# Patient Record
Sex: Female | Born: 1970 | Race: White | Hispanic: No | Marital: Married | Smoking: Never smoker
Health system: Southern US, Community
[De-identification: ages and names within clinical notes are randomized; demographics above are authoritative.]

## PROBLEM LIST (undated history)

## (undated) DIAGNOSIS — T7840XA Allergy, unspecified, initial encounter: Secondary | ICD-10-CM

## (undated) DIAGNOSIS — N390 Urinary tract infection, site not specified: Secondary | ICD-10-CM

## (undated) DIAGNOSIS — A159 Respiratory tuberculosis unspecified: Secondary | ICD-10-CM

## (undated) DIAGNOSIS — G43909 Migraine, unspecified, not intractable, without status migrainosus: Secondary | ICD-10-CM

## (undated) HISTORY — DX: Migraine, unspecified, not intractable, without status migrainosus: G43.909

## (undated) HISTORY — DX: Urinary tract infection, site not specified: N39.0

## (undated) HISTORY — DX: Respiratory tuberculosis unspecified: A15.9

## (undated) HISTORY — DX: Allergy, unspecified, initial encounter: T78.40XA

## (undated) HISTORY — PX: APPENDECTOMY: SHX54

---
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
## 2015-01-26 ENCOUNTER — Other Ambulatory Visit (HOSPITAL_BASED_OUTPATIENT_CLINIC_OR_DEPARTMENT_OTHER): Payer: Self-pay | Admitting: Family Medicine

## 2015-01-26 DIAGNOSIS — Z1231 Encounter for screening mammogram for malignant neoplasm of breast: Secondary | ICD-10-CM

## 2015-02-09 ENCOUNTER — Ambulatory Visit (HOSPITAL_BASED_OUTPATIENT_CLINIC_OR_DEPARTMENT_OTHER)
Admission: RE | Admit: 2015-02-09 | Discharge: 2015-02-09 | Disposition: A | Discharge status: Home / Self Care | Payer: BLUE CROSS/BLUE SHIELD | Source: Ambulatory Visit | Attending: Family Medicine | Admitting: Family Medicine

## 2015-02-09 DIAGNOSIS — Z1231 Encounter for screening mammogram for malignant neoplasm of breast: Secondary | ICD-10-CM | POA: Diagnosis present

## 2015-02-10 ENCOUNTER — Ambulatory Visit (HOSPITAL_BASED_OUTPATIENT_CLINIC_OR_DEPARTMENT_OTHER): Payer: Self-pay

## 2015-02-10 ENCOUNTER — Inpatient Hospital Stay (HOSPITAL_BASED_OUTPATIENT_CLINIC_OR_DEPARTMENT_OTHER): Admission: RE | Admit: 2015-02-10 | Payer: Self-pay | Source: Ambulatory Visit

## 2016-01-22 ENCOUNTER — Other Ambulatory Visit (HOSPITAL_BASED_OUTPATIENT_CLINIC_OR_DEPARTMENT_OTHER): Payer: Self-pay | Admitting: Physician Assistant

## 2016-01-22 ENCOUNTER — Other Ambulatory Visit (HOSPITAL_BASED_OUTPATIENT_CLINIC_OR_DEPARTMENT_OTHER): Payer: Self-pay | Admitting: *Deleted

## 2016-01-22 DIAGNOSIS — Z1231 Encounter for screening mammogram for malignant neoplasm of breast: Secondary | ICD-10-CM

## 2016-02-23 ENCOUNTER — Ambulatory Visit (HOSPITAL_BASED_OUTPATIENT_CLINIC_OR_DEPARTMENT_OTHER)
Admission: RE | Admit: 2016-02-23 | Discharge: 2016-02-23 | Disposition: A | Discharge status: Home / Self Care | Payer: BLUE CROSS/BLUE SHIELD | Source: Ambulatory Visit | Attending: Physician Assistant | Admitting: Physician Assistant

## 2016-02-23 DIAGNOSIS — Z1231 Encounter for screening mammogram for malignant neoplasm of breast: Secondary | ICD-10-CM | POA: Insufficient documentation

## 2017-01-23 ENCOUNTER — Other Ambulatory Visit (HOSPITAL_BASED_OUTPATIENT_CLINIC_OR_DEPARTMENT_OTHER): Payer: Self-pay | Admitting: *Deleted

## 2017-01-23 ENCOUNTER — Other Ambulatory Visit: Payer: Self-pay | Admitting: Family Medicine

## 2017-01-23 DIAGNOSIS — Z1231 Encounter for screening mammogram for malignant neoplasm of breast: Secondary | ICD-10-CM

## 2017-02-24 ENCOUNTER — Ambulatory Visit (HOSPITAL_BASED_OUTPATIENT_CLINIC_OR_DEPARTMENT_OTHER)
Admission: RE | Admit: 2017-02-24 | Discharge: 2017-02-24 | Disposition: A | Discharge status: Home / Self Care | Payer: BLUE CROSS/BLUE SHIELD | Source: Ambulatory Visit | Attending: Family Medicine | Admitting: Family Medicine

## 2017-02-24 DIAGNOSIS — Z1231 Encounter for screening mammogram for malignant neoplasm of breast: Secondary | ICD-10-CM | POA: Insufficient documentation

## 2018-02-09 ENCOUNTER — Other Ambulatory Visit (HOSPITAL_BASED_OUTPATIENT_CLINIC_OR_DEPARTMENT_OTHER): Payer: Self-pay

## 2018-02-09 ENCOUNTER — Other Ambulatory Visit (HOSPITAL_BASED_OUTPATIENT_CLINIC_OR_DEPARTMENT_OTHER): Payer: Self-pay | Admitting: Family Medicine

## 2018-02-09 ENCOUNTER — Other Ambulatory Visit (HOSPITAL_BASED_OUTPATIENT_CLINIC_OR_DEPARTMENT_OTHER): Payer: Self-pay | Admitting: Pediatric Pulmonology

## 2018-02-09 DIAGNOSIS — Z1231 Encounter for screening mammogram for malignant neoplasm of breast: Secondary | ICD-10-CM

## 2018-02-24 ENCOUNTER — Other Ambulatory Visit (HOSPITAL_BASED_OUTPATIENT_CLINIC_OR_DEPARTMENT_OTHER): Payer: Self-pay | Admitting: Physician Assistant

## 2018-02-24 ENCOUNTER — Ambulatory Visit (HOSPITAL_BASED_OUTPATIENT_CLINIC_OR_DEPARTMENT_OTHER)
Admission: RE | Admit: 2018-02-24 | Discharge: 2018-02-24 | Disposition: A | Discharge status: Home / Self Care | Payer: 59 | Source: Ambulatory Visit | Attending: Family Medicine | Admitting: Family Medicine

## 2018-02-24 DIAGNOSIS — Z1231 Encounter for screening mammogram for malignant neoplasm of breast: Secondary | ICD-10-CM | POA: Insufficient documentation

## 2019-02-24 ENCOUNTER — Other Ambulatory Visit (HOSPITAL_BASED_OUTPATIENT_CLINIC_OR_DEPARTMENT_OTHER): Payer: Self-pay | Admitting: Physician Assistant

## 2019-02-24 DIAGNOSIS — Z1231 Encounter for screening mammogram for malignant neoplasm of breast: Secondary | ICD-10-CM

## 2019-03-04 ENCOUNTER — Ambulatory Visit (HOSPITAL_BASED_OUTPATIENT_CLINIC_OR_DEPARTMENT_OTHER)
Admission: RE | Admit: 2019-03-04 | Discharge: 2019-03-04 | Disposition: A | Discharge status: Home / Self Care | Payer: 59 | Source: Ambulatory Visit | Attending: Physician Assistant | Admitting: Physician Assistant

## 2019-03-04 ENCOUNTER — Other Ambulatory Visit: Payer: Self-pay

## 2019-03-04 DIAGNOSIS — Z1231 Encounter for screening mammogram for malignant neoplasm of breast: Secondary | ICD-10-CM | POA: Diagnosis not present

## 2019-04-30 LAB — BASIC METABOLIC PANEL
BUN: 11 (ref 4–21)
CO2: 27 — AB (ref 13–22)
Chloride: 105 (ref 99–108)
Creatinine: 0.8 (ref 0.5–1.1)
Glucose: 88
Potassium: 3.5 (ref 3.4–5.3)
Sodium: 140 (ref 137–147)

## 2019-04-30 LAB — HEPATIC FUNCTION PANEL
ALT: 22 (ref 7–35)
AST: 19 (ref 13–35)
Alkaline Phosphatase: 46 (ref 25–125)
Bilirubin, Total: 0.6

## 2019-04-30 LAB — LIPID PANEL
Cholesterol: 179 (ref 0–200)
HDL: 43 (ref 35–70)
LDL Cholesterol: 117
LDl/HDL Ratio: 4.2
Triglycerides: 104 (ref 40–160)

## 2019-04-30 LAB — COMPREHENSIVE METABOLIC PANEL
Albumin: 4.2 (ref 3.5–5.0)
Calcium: 8.9 (ref 8.7–10.7)
GFR calc Af Amer: 95
GFR calc non Af Amer: 79

## 2020-02-29 ENCOUNTER — Other Ambulatory Visit (HOSPITAL_BASED_OUTPATIENT_CLINIC_OR_DEPARTMENT_OTHER): Payer: Self-pay | Admitting: Family Medicine

## 2020-02-29 DIAGNOSIS — Z1231 Encounter for screening mammogram for malignant neoplasm of breast: Secondary | ICD-10-CM

## 2020-03-06 ENCOUNTER — Other Ambulatory Visit (HOSPITAL_BASED_OUTPATIENT_CLINIC_OR_DEPARTMENT_OTHER): Payer: Self-pay | Admitting: *Deleted

## 2020-03-06 ENCOUNTER — Other Ambulatory Visit (HOSPITAL_BASED_OUTPATIENT_CLINIC_OR_DEPARTMENT_OTHER): Payer: Self-pay | Admitting: Nurse Practitioner

## 2020-03-06 ENCOUNTER — Ambulatory Visit (HOSPITAL_BASED_OUTPATIENT_CLINIC_OR_DEPARTMENT_OTHER)
Admission: RE | Admit: 2020-03-06 | Discharge: 2020-03-06 | Disposition: A | Discharge status: Home / Self Care | Payer: 59 | Source: Ambulatory Visit | Attending: Family Medicine | Admitting: Family Medicine

## 2020-03-06 ENCOUNTER — Other Ambulatory Visit: Payer: Self-pay

## 2020-03-06 DIAGNOSIS — Z1231 Encounter for screening mammogram for malignant neoplasm of breast: Secondary | ICD-10-CM | POA: Diagnosis not present

## 2020-03-06 LAB — HM MAMMOGRAPHY

## 2021-02-20 ENCOUNTER — Other Ambulatory Visit: Payer: Self-pay

## 2021-02-20 ENCOUNTER — Encounter: Payer: Self-pay | Admitting: Family Medicine

## 2021-02-20 ENCOUNTER — Ambulatory Visit: Payer: No Typology Code available for payment source | Admitting: Family Medicine

## 2021-02-20 VITALS — BP 122/75 | HR 79 | Temp 98.3°F | Ht 66.25 in | Wt 178.0 lb

## 2021-02-20 DIAGNOSIS — N951 Menopausal and female climacteric states: Secondary | ICD-10-CM | POA: Diagnosis not present

## 2021-02-20 DIAGNOSIS — G8929 Other chronic pain: Secondary | ICD-10-CM

## 2021-02-20 DIAGNOSIS — M25512 Pain in left shoulder: Secondary | ICD-10-CM

## 2021-02-20 DIAGNOSIS — N926 Irregular menstruation, unspecified: Secondary | ICD-10-CM | POA: Insufficient documentation

## 2021-02-20 DIAGNOSIS — Z23 Encounter for immunization: Secondary | ICD-10-CM | POA: Diagnosis not present

## 2021-02-20 DIAGNOSIS — F418 Other specified anxiety disorders: Secondary | ICD-10-CM

## 2021-02-20 DIAGNOSIS — R232 Flushing: Secondary | ICD-10-CM

## 2021-02-20 DIAGNOSIS — E559 Vitamin D deficiency, unspecified: Secondary | ICD-10-CM | POA: Insufficient documentation

## 2021-02-20 MED ORDER — MELOXICAM 7.5 MG PO TABS: ORAL_TABLET | ORAL | 5 refills | Status: DC

## 2021-02-20 MED ORDER — ESCITALOPRAM OXALATE 10 MG PO TABS: 10.0000 mg | ORAL_TABLET | Freq: Every day | ORAL | 1 refills | Status: DC

## 2021-02-20 NOTE — Progress Notes (Signed)
Patient ID: Nichole Ray, female  DOB: 1971/01/15, 51 y.o.   MRN: [id number] Patient Care Team    Relationship Specialty Notifications Start End  Natalia LeatherwoodKuneff, Sativa Gelles A, DO PCP - General Family Medicine  02/20/21   Gelene MinkKoop, Timothy, OD Referring Physician Optometry  02/20/21     Chief Complaint  Patient presents with   Establish Care    Pt is not fasting   Shoulder Pain    Pt c/o L shoulder pain x 1 year; Pt has been seen and was given Mobic which had improved but still there;     Subjective:  Nichole Ray is a 51 y.o.  female present for new patient establishment/acute. All past medical history, surgical history, allergies, family history, immunizations, medications and social history were updated in the electronic medical record today. All recent labs, ED visits and hospitalizations within the last year were reviewed.  Shoulder pain: Patient reports she has had left shoulder pain for about a year.  She was provided with Mobic during that time, and felt it did help.  She has not been taking it recently.  She has noticed the pain is worse when resting or laying on that side.  She has some mild weakness.  She denies any known injury prior to onset of symptoms.  Anxiousness/hot flashes: Patient reports she has some anxiety surrounding her daughter's mental health.  This is causing her more anxiousness.  She also is in perimenopausal state and noticing hot flashes.  She would like to consider starting a medication to help with both of these matters.  [facility]  Decreased Interest 0  Down, Depressed, Hopeless 0  PHQ - 2 Score 0    No flowsheet data found.      No flowsheet data found.   Immunization History  Administered Date(s) Administered   Influenza Split 12/15/2018   Influenza,inj,Quad PF,6+ Mos 11/18/2017, 02/20/2021   Influenza,inj,quad, With Preservative 01/24/2015   Influenza-Unspecified 11/22/2015   Moderna SARS-COV2 Booster Vaccination  01/08/2020   Moderna Sars-Covid-2 Vaccination 04/19/2019, 05/17/2019   Td 04/25/2009    No results found.  Past Medical History:  Diagnosis Date   Allergies    Migraines    Tuberculosis    UTI (urinary tract infection)    Allergies  Allergen Reactions   Penicillins Rash   Past Surgical History:  Procedure Laterality Date   APPENDECTOMY  1990   Family History  Problem Relation Age of Onset   Hypertension Mother    Alcohol abuse Father    Asthma Paternal Grandfather    Arthritis Paternal Grandfather    Depression Daughter    Social History   Social History Narrative   Marital status/children/pets: Married   Education/employment: Bachelor's degree, employed.   Safety:      -smoke alarm in the home:Yes     - wears seatbelt: Yes     - Feels safe in their relationships: Yes       Allergies as of 02/20/2021       Reactions   Penicillins Rash        Medication List        Accurate as of February 20, 2021 11:59 PM. If you have any questions, ask your nurse or doctor.          escitalopram 10 MG tablet Commonly known as: Lexapro Take 1 tablet (10 mg total) by mouth daily. Started by: Felix Pacinienee Donathan Buller, DO   Melatonin 10 MG Tabs Take by mouth.  meloxicam 7.5 MG tablet Commonly known as: MOBIC Nightly after dinner. Started by: Felix Pacini, DO        All past medical history, surgical history, allergies, family history, immunizations andmedications were updated in the EMR today and reviewed under the history and medication portions of their EMR.    No results found for this or any previous visit (from the past 2160 hour(s)).    ROS 14 pt review of systems performed and negative (unless mentioned in an HPI)  Objective:  BP 122/75    Pulse 79    Temp 98.3 F (36.8 C) (Oral)    Ht 5' 6.25" (1.683 m)    Wt 178 lb (80.7 kg)    SpO2 97%    BMI 28.51 kg/m   Physical Exam Vitals and nursing note reviewed.  Constitutional:      General: She is not in  acute distress.    Appearance: Normal appearance. She is not ill-appearing, toxic-appearing or diaphoretic.  HENT:     Head: Normocephalic and atraumatic.  Eyes:     General: No scleral icterus.       Right eye: No discharge.        Left eye: No discharge.     Extraocular Movements: Extraocular movements intact.     Conjunctiva/sclera: Conjunctivae normal.     Pupils: Pupils are equal, round, and reactive to light.  Cardiovascular:     Rate and Rhythm: Normal rate and regular rhythm.     Heart sounds: No murmur heard.   No friction rub. No gallop.  Pulmonary:     Effort: Pulmonary effort is normal. No respiratory distress.     Breath sounds: Normal breath sounds. No wheezing, rhonchi or rales.  Musculoskeletal:     Right shoulder: Normal.     Left shoulder: No swelling, deformity, effusion, laceration, tenderness, bony tenderness or crepitus. Normal range of motion. Decreased strength. Normal pulse.     Cervical back: Neck supple. No tenderness.     Comments: Positive Hawkins, negative O'Brien's, negative liftoff test.  Lymphadenopathy:     Cervical: No cervical adenopathy.  Skin:    General: Skin is warm and dry.     Coloration: Skin is not jaundiced or pale.     Findings: No erythema or rash.  Neurological:     Mental Status: She is alert and oriented to person, place, and time. Mental status is at baseline.     Motor: No weakness.     Gait: Gait normal.  Psychiatric:        Mood and Affect: Mood normal.        Behavior: Behavior normal.        Thought Content: Thought content normal.        Judgment: Judgment normal.     Comments: Tearful today.       Assessment/plan: [name] is a 51 y.o. female present for est care with acute complaints.  Chronic left shoulder pain Suspect rotator strain at 1 time that is not properly healing.  Possibly small/partial tear. Continue Mobic Obtain shoulder x-ray and if normal, refer to physical therapy. - DG Shoulder Left;  Future  Situational anxiety/hot flashes/perimenopausal Discussed options with her today on different types of medicines that can help with the anxiety and is also known to help decrease hot flashes.  She is willing to try. Start Lexapro 10 mg daily Follow-up 8 weeks for follow-up and CPE.,  Sooner if worsening  Influenza vaccination given - Flu Vaccine QUAD  6+ mos PF IM (Fluarix Quad PF)   Return in 8 weeks (on 04/17/2021) for CPE (30 min) with in next 3-6 mos. w/ fasting labs.  Orders Placed This Encounter  Procedures   DG Shoulder Left   Flu Vaccine QUAD 6+ mos PF IM (Fluarix Quad PF)   Meds ordered this encounter  Medications   meloxicam (MOBIC) 7.5 MG tablet    Sig: Nightly after dinner.    Dispense:  30 tablet    Refill:  5   escitalopram (LEXAPRO) 10 MG tablet    Sig: Take 1 tablet (10 mg total) by mouth daily.    Dispense:  90 tablet    Refill:  1   Referral Orders  No referral(s) requested today     Note is dictated utilizing voice recognition software. Although note has been proof read prior to signing, occasional typographical errors still can be missed. If any questions arise, please do not hesitate to call for verification.  Electronically signed by: Felix Pacini, DO [facility] [place]

## 2021-02-20 NOTE — Patient Instructions (Addendum)
°  Start meloxicam after dinner daily.  Have xray completed at [facility] is located at [address] [place], [place] [address]  Start lexapro 10 mg a day, can be taken anytime of the day, just stick to a routine time when possible.   We will likely place a referral to physical therapy once xray returns (if normal)

## 2021-02-26 ENCOUNTER — Other Ambulatory Visit (HOSPITAL_BASED_OUTPATIENT_CLINIC_OR_DEPARTMENT_OTHER): Payer: Self-pay | Admitting: Family Medicine

## 2021-02-26 ENCOUNTER — Encounter: Payer: Self-pay | Admitting: Family Medicine

## 2021-02-26 DIAGNOSIS — Z1231 Encounter for screening mammogram for malignant neoplasm of breast: Secondary | ICD-10-CM

## 2021-03-12 ENCOUNTER — Telehealth: Payer: Self-pay | Admitting: Family Medicine

## 2021-03-12 ENCOUNTER — Ambulatory Visit (HOSPITAL_BASED_OUTPATIENT_CLINIC_OR_DEPARTMENT_OTHER): Payer: BLUE CROSS/BLUE SHIELD

## 2021-03-12 NOTE — Telephone Encounter (Signed)
FYI:  Pt came into office on 03/12/2021 in regards to a referral for an x-ray.  I didn't see a referral for an x-ray, however I did see as for her last visit check-out section PAP Smear below  Please request pap smear report from 04/30/19 with Mitzi Hansen.  Thanks  Pt said not a pap smear, but x-ray.

## 2021-03-12 NOTE — Telephone Encounter (Signed)
Preferred imaging location? [facility]     Pt should be able to just walk in for xray. Order is under imaging for her shoulder.   LVM for pt to call back to discuss.

## 2021-03-13 NOTE — Telephone Encounter (Signed)
Patient calling back regarding xray.  Patient thought someone called with results (?)  Please call [phone]

## 2021-03-14 NOTE — Telephone Encounter (Signed)
Called pt that she will need to have the XR done prior to having results.

## 2021-03-16 ENCOUNTER — Ambulatory Visit (INDEPENDENT_AMBULATORY_CARE_PROVIDER_SITE_OTHER): Payer: No Typology Code available for payment source

## 2021-03-16 ENCOUNTER — Other Ambulatory Visit: Payer: Self-pay

## 2021-03-16 DIAGNOSIS — G8929 Other chronic pain: Secondary | ICD-10-CM | POA: Diagnosis not present

## 2021-03-16 DIAGNOSIS — M25512 Pain in left shoulder: Secondary | ICD-10-CM

## 2021-03-19 ENCOUNTER — Telehealth: Payer: Self-pay | Admitting: Family Medicine

## 2021-03-19 DIAGNOSIS — G8929 Other chronic pain: Secondary | ICD-10-CM

## 2021-03-19 DIAGNOSIS — M25512 Pain in left shoulder: Secondary | ICD-10-CM

## 2021-03-19 NOTE — Telephone Encounter (Signed)
Patient notified of note below. 

## 2021-03-19 NOTE — Telephone Encounter (Signed)
Please inform patient her shoulder x-ray is normal. I have placed a referral to [facility] physical therapy.

## 2021-03-20 ENCOUNTER — Encounter (HOSPITAL_BASED_OUTPATIENT_CLINIC_OR_DEPARTMENT_OTHER): Payer: Self-pay

## 2021-03-20 ENCOUNTER — Ambulatory Visit (HOSPITAL_BASED_OUTPATIENT_CLINIC_OR_DEPARTMENT_OTHER)
Admission: RE | Admit: 2021-03-20 | Discharge: 2021-03-20 | Disposition: A | Discharge status: Home / Self Care | Payer: No Typology Code available for payment source | Source: Ambulatory Visit | Attending: Family Medicine | Admitting: Family Medicine

## 2021-03-20 ENCOUNTER — Other Ambulatory Visit: Payer: Self-pay

## 2021-03-20 DIAGNOSIS — Z1231 Encounter for screening mammogram for malignant neoplasm of breast: Secondary | ICD-10-CM | POA: Diagnosis present

## 2021-04-18 ENCOUNTER — Other Ambulatory Visit: Payer: Self-pay

## 2021-04-18 ENCOUNTER — Encounter: Payer: Self-pay | Admitting: Family Medicine

## 2021-04-18 ENCOUNTER — Ambulatory Visit (INDEPENDENT_AMBULATORY_CARE_PROVIDER_SITE_OTHER): Payer: No Typology Code available for payment source | Admitting: Family Medicine

## 2021-04-18 VITALS — BP 100/64 | HR 67 | Temp 98.2°F | Ht 66.25 in | Wt 181.0 lb

## 2021-04-18 DIAGNOSIS — E559 Vitamin D deficiency, unspecified: Secondary | ICD-10-CM

## 2021-04-18 DIAGNOSIS — R232 Flushing: Secondary | ICD-10-CM | POA: Diagnosis not present

## 2021-04-18 DIAGNOSIS — Z114 Encounter for screening for human immunodeficiency virus [HIV]: Secondary | ICD-10-CM

## 2021-04-18 DIAGNOSIS — Z79899 Other long term (current) drug therapy: Secondary | ICD-10-CM

## 2021-04-18 DIAGNOSIS — Z1211 Encounter for screening for malignant neoplasm of colon: Secondary | ICD-10-CM

## 2021-04-18 DIAGNOSIS — Z1231 Encounter for screening mammogram for malignant neoplasm of breast: Secondary | ICD-10-CM | POA: Diagnosis not present

## 2021-04-18 DIAGNOSIS — F418 Other specified anxiety disorders: Secondary | ICD-10-CM | POA: Diagnosis not present

## 2021-04-18 DIAGNOSIS — N926 Irregular menstruation, unspecified: Secondary | ICD-10-CM | POA: Diagnosis not present

## 2021-04-18 DIAGNOSIS — E663 Overweight: Secondary | ICD-10-CM

## 2021-04-18 DIAGNOSIS — Z1159 Encounter for screening for other viral diseases: Secondary | ICD-10-CM

## 2021-04-18 DIAGNOSIS — Z0000 Encounter for general adult medical examination without abnormal findings: Secondary | ICD-10-CM | POA: Diagnosis not present

## 2021-04-18 LAB — COMPREHENSIVE METABOLIC PANEL
ALT: 21 U/L (ref 0–35)
AST: 21 U/L (ref 0–37)
Albumin: 4.2 g/dL (ref 3.5–5.2)
Alkaline Phosphatase: 51 U/L (ref 39–117)
BUN: 14 mg/dL (ref 6–23)
CO2: 25 mEq/L (ref 19–32)
Calcium: 8.9 mg/dL (ref 8.4–10.5)
Chloride: 106 mEq/L (ref 96–112)
Creatinine, Ser: 0.75 mg/dL (ref 0.40–1.20)
GFR: 93.11 mL/min (ref >60.00)
Glucose, Bld: 91 mg/dL (ref 70–99)
Potassium: 4.1 mEq/L (ref 3.5–5.1)
Sodium: 139 mEq/L (ref 135–145)
Total Bilirubin: 0.6 mg/dL (ref 0.2–1.2)
Total Protein: 6.7 g/dL (ref 6.0–8.3)

## 2021-04-18 LAB — CBC
HCT: 41 % (ref 36.0–46.0)
Hemoglobin: 13.7 g/dL (ref 12.0–15.0)
MCHC: 33.5 g/dL (ref 30.0–36.0)
MCV: 95.5 fl (ref 78.0–100.0)
Platelets: 326 10*3/uL (ref 150.0–400.0)
RBC: 4.3 Mil/uL (ref 3.87–5.11)
RDW: 13.5 % (ref 11.5–15.5)
WBC: 4.6 10*3/uL (ref 4.0–10.5)

## 2021-04-18 LAB — LIPID PANEL
Cholesterol: 177 mg/dL (ref 0–200)
HDL: 48.8 mg/dL (ref >39.00)
LDL Cholesterol: 111 mg/dL — ABNORMAL HIGH (ref 0–99)
NonHDL: 127.98
Total CHOL/HDL Ratio: 4
Triglycerides: 86 mg/dL (ref 0.0–149.0)
VLDL: 17.2 mg/dL (ref 0.0–40.0)

## 2021-04-18 LAB — HEMOGLOBIN A1C: Hgb A1c MFr Bld: 5.3 % (ref 4.6–6.5)

## 2021-04-18 LAB — VITAMIN D 25 HYDROXY (VIT D DEFICIENCY, FRACTURES): VITD: 22.25 ng/mL — ABNORMAL LOW (ref 30.00–100.00)

## 2021-04-18 LAB — TSH: TSH: 1.19 u[IU]/mL (ref 0.35–5.50)

## 2021-04-18 NOTE — Patient Instructions (Signed)

## 2021-04-18 NOTE — Progress Notes (Signed)
This visit occurred during the SARS-CoV-2 public health emergency.  Safety protocols were in place, including screening questions prior to the visit, additional usage of staff PPE, and extensive cleaning of exam room while observing appropriate contact time as indicated for disinfecting solutions.    Patient ID: Nichole Ray, female  DOB: 1970-11-03, 51 y.o.   MRN: [id number] Patient Care Team    Relationship Specialty Notifications Start End  Natalia Leatherwood, DO PCP - General Family Medicine  02/20/21   Gelene Mink, OD Referring Physician Optometry  02/20/21     Chief Complaint  Patient presents with   Annual Exam    Pt is fasting    Subjective: Nichole Ray is a 51 y.o.  Female  present for CPE/cmc. All past medical history, surgical history, allergies, family history, immunizations, medications and social history were updated in the electronic medical record today. All recent labs, ED visits and hospitalizations within the last year were reviewed.  Health maintenance:  Colonoscopy: no fhx. Screening due > discussed options > cologuard ordered today Mammogram: completed:[facility]/2021, [facility] Cervical cancer screening: last pap: 2021 Immunizations: tdap DUE (she thinks she had last year), Influenza UTD 02/2021 (encouraged yearly), covid UTD, shingrix series after 04/22/2021 if desired by nurse appt.  Infectious disease screening: HIV and Hep C agreeable to testing. DEXA: routine screen Assistive device: none Oxygen WUJ:WJXB Patient has a Dental home. Hospitalizations/ED visits: reviewed    Anxiousness/hot flashes: Patient reports she has some anxiety surrounding her daughter's mental health.  This is causing her more anxiousness.  She also is in perimenopausal state and noticing hot flashes.  Lexapro  was started about 7 weeks ago and patient reports she is better with anxiety and hotflashes  Depression screen [facility] 2/9 04/18/2021 02/20/2021  Decreased Interest 0 0  Down,  Depressed, Hopeless 0 0  PHQ - 2 Score 0 0  Altered sleeping 1 -  Tired, decreased energy 1 -  Change in appetite 0 -  Feeling bad or failure about yourself  0 -  Trouble concentrating 0 -  Moving slowly or fidgety/restless 0 -  Suicidal thoughts 0 -  PHQ-9 Score 2 -   GAD 7 : Generalized Anxiety Score 04/18/2021  Nervous, Anxious, on Edge 0  Control/stop worrying 0  Worry too much - different things 1  Trouble relaxing 0  Restless 0  Easily annoyed or irritable 0  Afraid - awful might happen 1  Total GAD 7 Score 2      Immunization History  Administered Date(s) Administered   Influenza Split 12/15/2018   Influenza,inj,Quad PF,6+ Mos 11/18/2017, 02/20/2021   Influenza,inj,quad, With Preservative 01/24/2015   Influenza-Unspecified 11/22/2015   Moderna SARS-COV2 Booster Vaccination 01/08/2020   Moderna Sars-Covid-2 Vaccination 04/19/2019, 05/17/2019   Td 04/25/2009   Tdap 02/12/2020     Past Medical History:  Diagnosis Date   Allergies    Migraines    Tuberculosis    UTI (urinary tract infection)    Allergies  Allergen Reactions   Penicillins Rash   Past Surgical History:  Procedure Laterality Date   APPENDECTOMY  1990   Family History  Problem Relation Age of Onset   Hypertension Mother    Alcohol abuse Father    Asthma Paternal Grandfather    Arthritis Paternal Grandfather    Depression Daughter    Social History   Social History Narrative   Marital status/children/pets: Married   Education/employment: Bachelor's degree, employed.   Safety:      -smoke alarm  in the home:Yes     - wears seatbelt: Yes     - Feels safe in their relationships: Yes       Allergies as of 04/18/2021       Reactions   Penicillins Rash        Medication List        Accurate as of April 18, 2021  9:52 AM. If you have any questions, ask your nurse or doctor.          STOP taking these medications    Melatonin 10 MG Tabs Stopped by: Felix Pacini, DO    meloxicam 7.5 MG tablet Commonly known as: MOBIC Stopped by: Felix Pacini, DO       TAKE these medications    escitalopram 10 MG tablet Commonly known as: Lexapro Take 1 tablet (10 mg total) by mouth daily.        All past medical history, surgical history, allergies, family history, immunizations andmedications were updated in the EMR today and reviewed under the history and medication portions of their EMR.     No results found for this or any previous visit (from the past 2160 hour(s)).  MM 3D SCREEN BREAST BILATERAL  Result Date: 03/20/2021 CLINICAL DATA:  Screening. EXAM: DIGITAL SCREENING BILATERAL MAMMOGRAM WITH TOMOSYNTHESIS AND CAD TECHNIQUE: Bilateral screening digital craniocaudal and mediolateral oblique mammograms were obtained. Bilateral screening digital breast tomosynthesis was performed. The images were evaluated with computer-aided detection. COMPARISON:  Previous exam(s). ACR Breast Density Category b: There are scattered areas of fibroglandular density. FINDINGS: There are no findings suspicious for malignancy. IMPRESSION: No mammographic evidence of malignancy. A result letter of this screening mammogram will be mailed directly to the patient. RECOMMENDATION: Screening mammogram in one year. (Code:SM-B-01Y) BI-RADS CATEGORY  1: Negative. Electronically Signed   By: Sande Brothers M.D.   On: 03/20/2021 16:24     ROS 14 pt review of systems performed and negative (unless mentioned in an HPI)  Objective: BP 100/64    Pulse 67    Temp 98.2 F (36.8 C) (Oral)    Ht 5' 6.25" (1.683 m)    Wt 181 lb (82.1 kg)    SpO2 100%    BMI 28.99 kg/m  Physical Exam Vitals and nursing note reviewed.  Constitutional:      General: She is not in acute distress.    Appearance: Normal appearance. She is not ill-appearing or toxic-appearing.  HENT:     Head: Normocephalic and atraumatic.     Right Ear: Tympanic membrane, ear canal and external ear normal. There is no impacted  cerumen.     Left Ear: Tympanic membrane, ear canal and external ear normal. There is no impacted cerumen.     Nose: No congestion or rhinorrhea.     Mouth/Throat:     Mouth: Mucous membranes are moist.     Pharynx: Oropharynx is clear. No oropharyngeal exudate or posterior oropharyngeal erythema.  Eyes:     General: No scleral icterus.       Right eye: No discharge.        Left eye: No discharge.     Extraocular Movements: Extraocular movements intact.     Conjunctiva/sclera: Conjunctivae normal.     Pupils: Pupils are equal, round, and reactive to light.  Cardiovascular:     Rate and Rhythm: Normal rate and regular rhythm.     Pulses: Normal pulses.     Heart sounds: Normal heart sounds. No murmur heard.   No friction  rub. No gallop.  Pulmonary:     Effort: Pulmonary effort is normal. No respiratory distress.     Breath sounds: Normal breath sounds. No stridor. No wheezing, rhonchi or rales.  Chest:     Chest wall: No tenderness.  Abdominal:     General: Abdomen is flat. Bowel sounds are normal. There is no distension.     Palpations: Abdomen is soft. There is no mass.     Tenderness: There is no abdominal tenderness. There is no right CVA tenderness, left CVA tenderness, guarding or rebound.     Hernia: No hernia is present.  Musculoskeletal:        General: No swelling, tenderness or deformity. Normal range of motion.     Cervical back: Normal range of motion and neck supple. No rigidity or tenderness.     Right lower leg: No edema.     Left lower leg: No edema.  Lymphadenopathy:     Cervical: No cervical adenopathy.  Skin:    General: Skin is warm and dry.     Coloration: Skin is not jaundiced or pale.     Findings: No bruising, erythema, lesion or rash.  Neurological:     General: No focal deficit present.     Mental Status: She is alert and oriented to person, place, and time. Mental status is at baseline.     Cranial Nerves: No cranial nerve deficit.     Sensory: No  sensory deficit.     Motor: No weakness.     Coordination: Coordination normal.     Gait: Gait normal.     Deep Tendon Reflexes: Reflexes normal.  Psychiatric:        Mood and Affect: Mood normal.        Behavior: Behavior normal.        Thought Content: Thought content normal.        Judgment: Judgment normal.      No results found.  Assessment/plan: Quinnley Colasurdo is a 51 y.o. female present for CPE/CMC Vitamin D deficiency - VITAMIN D 25 Hydroxy (Vit-D Deficiency, Fractures)  Irregular menstrual cycle/Situational anxiety/Hot flashes Mush improved.  Continue lexapro 10 mg qd - CBC - TSH F/u 5.5 mos Overweight (BMI 25.0-29.9) - Lipid panel - discussed diet and exercise.  Encounter for long-term current use of medication - Comprehensive metabolic panel - Hemoglobin A1c Encounter for screening mammogram for malignant neoplasm of breast - MM 3D SCREEN BREAST BILATERAL; Future Colon cancer screening - Cologuard Encounter for hepatitis C screening test for low risk patient - Hepatitis C Antibody Encounter for screening for HIV - HIV antibody (with reflex) Routine general medical examination at a health care facility Colonoscopy: no fhx. Screening due > discussed options > cologuard ordered today Mammogram: completed:[facility]/2021, [facility] Cervical cancer screening: last pap: 2021 Immunizations: tdap DUE (she thinks she had last year), Influenza UTD 02/2021 (encouraged yearly), covid UTD, shingrix series after 04/22/2021 if desired by nurse appt.  Infectious disease screening: HIV and Hep C agreeable to testing. DEXA: routine screen Patient was encouraged to exercise greater than 150 minutes a week. Patient was encouraged to choose a diet filled with fresh fruits and vegetables, and lean meats. AVS provided to patient today for education/recommendation on gender specific health and safety maintenance.  Return in about 24 weeks (around 10/03/2021) for CMC (30 min).   Orders  Placed This Encounter  Procedures   MM 3D SCREEN BREAST BILATERAL   Comprehensive metabolic panel   CBC  Hemoglobin A1c   Lipid panel   TSH   VITAMIN D 25 Hydroxy (Vit-D Deficiency, Fractures)   Cologuard   HIV antibody (with reflex)   Hepatitis C Antibody   No orders of the defined types were placed in this encounter.  Referral Orders  No referral(s) requested today     Electronically signed by: Felix Pacinienee Jameila Keeny, DO [facility]

## 2021-04-19 LAB — HEPATITIS C ANTIBODY
Hepatitis C Ab: NONREACTIVE
SIGNAL TO CUT-OFF: <0.02 (ref <1.00)

## 2021-04-19 LAB — HIV ANTIBODY (ROUTINE TESTING W REFLEX): HIV 1&2 Ab, 4th Generation: NONREACTIVE

## 2021-05-14 LAB — COLOGUARD: COLOGUARD: NEGATIVE

## 2021-08-16 ENCOUNTER — Other Ambulatory Visit: Payer: Self-pay | Admitting: Family Medicine

## 2021-09-18 ENCOUNTER — Other Ambulatory Visit: Payer: Self-pay | Admitting: Family Medicine

## 2021-10-09 ENCOUNTER — Ambulatory Visit: Payer: No Typology Code available for payment source | Admitting: Family Medicine

## 2021-10-18 ENCOUNTER — Encounter: Payer: Self-pay | Admitting: Family Medicine

## 2021-10-18 ENCOUNTER — Ambulatory Visit: Payer: No Typology Code available for payment source | Admitting: Family Medicine

## 2021-10-18 VITALS — BP 101/67 | HR 80 | Temp 98.1°F | Ht 66.25 in | Wt 167.0 lb

## 2021-10-18 DIAGNOSIS — Z23 Encounter for immunization: Secondary | ICD-10-CM | POA: Diagnosis not present

## 2021-10-18 DIAGNOSIS — F418 Other specified anxiety disorders: Secondary | ICD-10-CM | POA: Diagnosis not present

## 2021-10-18 DIAGNOSIS — R232 Flushing: Secondary | ICD-10-CM

## 2021-10-18 MED ORDER — ESCITALOPRAM OXALATE 10 MG PO TABS: 10.0000 mg | ORAL_TABLET | Freq: Every day | ORAL | 1 refills | Status: DC

## 2021-10-18 NOTE — Patient Instructions (Signed)
Return in about 6 months (around 04/22/2022) for cpe (20 min), Routine chronic condition follow-up.        Great to see you today.  I have refilled the medication(s) we provide.   If labs were collected, we will inform you of lab results once received either by echart message or telephone call.   - echart message- for normal results that have been seen by the patient already.   - telephone call: abnormal results or if patient has not viewed results in their echart.

## 2021-10-18 NOTE — Progress Notes (Signed)
Patient ID: Nichole Ray, female  DOB: 06-Oct-1970, 51 y.o.   MRN: [id number] Patient Care Team    Relationship Specialty Notifications Start End  Natalia Leatherwood, DO PCP - General Family Medicine  02/20/21   Gelene Mink, OD Referring Physician Optometry  02/20/21     Chief Complaint  Patient presents with   Anxiety    Cmc; pt is not fasting    Subjective: Nichole Ray is a 51 y.o.  Female  present for cmc. All past medical history, surgical history, allergies, family history, immunizations, medications and social history were updated in the electronic medical record today. All recent labs, ED visits and hospitalizations within the last year were reviewed.  Anxiousness/hot flashes: Today patient states Lexapro 10 mg is working well for her for stress and hot flashes. She has noticed she is mixing up words on occasion. She is wondering if that is stressed related. She is not sleeping restfully, but it has improved. She has lost weight unintentionally, but felt it was from being more active and less stress eating.   Prior note: Patient reports she has some anxiety surrounding her daughter's mental health.  This is causing her more anxiousness.  She also is in perimenopausal state and noticing hot flashes.  Lexapro 10mg  was started about 7 weeks ago and patient reports she is better with anxiety and hotflashes     10/18/2021    9:23 AM 04/18/2021    9:36 AM 02/20/2021    2:00 PM  Depression screen PHQ 2/9  Decreased Interest 0 0 0  Down, Depressed, Hopeless 0 0 0  PHQ - 2 Score 0 0 0  Altered sleeping 0 1   Tired, decreased energy 1 1   Change in appetite 0 0   Feeling bad or failure about yourself  0 0   Trouble concentrating 0 0   Moving slowly or fidgety/restless 0 0   Suicidal thoughts 0 0   PHQ-9 Score 1 2       10/18/2021    9:23 AM 04/18/2021    9:36 AM  GAD 7 : Generalized Anxiety Score  Nervous, Anxious, on Edge 0 0  Control/stop worrying 0 0  Worry too much -  different things 0 1  Trouble relaxing 0 0  Restless 0 0  Easily annoyed or irritable 0 0  Afraid - awful might happen 0 1  Total GAD 7 Score 0 2    Immunization History  Administered Date(s) Administered   Influenza Split 12/15/2018   Influenza,inj,Quad PF,6+ Mos 11/18/2017, 02/20/2021   Influenza,inj,quad, With Preservative 01/24/2015   Influenza-Unspecified 11/22/2015   Moderna SARS-COV2 Booster Vaccination 01/08/2020   Moderna Sars-Covid-2 Vaccination 04/19/2019, 05/17/2019   Td 04/25/2009   Tdap 02/12/2020   Zoster Recombinat (Shingrix) 10/18/2021     Past Medical History:  Diagnosis Date   Allergies    Migraines    Tuberculosis    UTI (urinary tract infection)    Allergies  Allergen Reactions   Penicillins Rash   Past Surgical History:  Procedure Laterality Date   APPENDECTOMY  1990   Family History  Problem Relation Age of Onset   Hypertension Mother    Alcohol abuse Father    Asthma Paternal Grandfather    Arthritis Paternal Grandfather    Depression Daughter    Social History   Social History Narrative   Marital status/children/pets: Married   Education/employment: Bachelor's degree, employed.   Safety:      -smoke alarm  in the home:Yes     - wears seatbelt: Yes     - Feels safe in their relationships: Yes       Allergies as of 10/18/2021       Reactions   Penicillins Rash        Medication List        Accurate as of October 18, 2021  9:46 AM. If you have any questions, ask your nurse or doctor.          escitalopram 10 MG tablet Commonly known as: LEXAPRO Take 1 tablet (10 mg total) by mouth daily.        All past medical history, surgical history, allergies, family history, immunizations andmedications were updated in the EMR today and reviewed under the history and medication portions of their EMR.     No results found for this or any previous visit (from the past 2160 hour(s)).  MM 3D SCREEN BREAST BILATERAL  Result  Date: 03/20/2021 CLINICAL DATA:  Screening. EXAM: DIGITAL SCREENING BILATERAL MAMMOGRAM WITH TOMOSYNTHESIS AND CAD TECHNIQUE: Bilateral screening digital craniocaudal and mediolateral oblique mammograms were obtained. Bilateral screening digital breast tomosynthesis was performed. The images were evaluated with computer-aided detection. COMPARISON:  Previous exam(s). ACR Breast Density Category b: There are scattered areas of fibroglandular density. FINDINGS: There are no findings suspicious for malignancy. IMPRESSION: No mammographic evidence of malignancy. A result letter of this screening mammogram will be mailed directly to the patient. RECOMMENDATION: Screening mammogram in one year. (Code:SM-B-01Y) BI-RADS CATEGORY  1: Negative. Electronically Signed   By: Sande Brothers M.D.   On: 03/20/2021 16:24     ROS 14 pt review of systems performed and negative (unless mentioned in an HPI)  Objective: BP 101/67   Pulse 80   Temp 98.1 F (36.7 C) (Oral)   Ht 5' 6.25" (1.683 m)   Wt 167 lb (75.8 kg)   SpO2 98%   BMI 26.75 kg/m  Physical Exam Vitals and nursing note reviewed.  Constitutional:      General: She is not in acute distress.    Appearance: Normal appearance. She is normal weight. She is not ill-appearing or toxic-appearing.  Eyes:     Extraocular Movements: Extraocular movements intact.     Conjunctiva/sclera: Conjunctivae normal.     Pupils: Pupils are equal, round, and reactive to light.  Neurological:     Mental Status: She is alert and oriented to person, place, and time. Mental status is at baseline.  Psychiatric:        Mood and Affect: Mood normal.        Behavior: Behavior normal.        Thought Content: Thought content normal.        Judgment: Judgment normal.       No results found.  Assessment/plan: [name] is a 51 y.o. female present for [facility] Vitamin D deficiency - VITAMIN D 25 Hydroxy (Vit-D Deficiency, Fractures) UTD> monitor yearly with CPE She has  noticed mixing up words on occasion (EX: calling the BR the kitchen etc- but corrected herself). If worsening will follow up sooner and b12, vit d thyroid check.   Situational anxiety/Hot flashes stable Continue  lexapro 10 mg qd  Shingrix: #1 provided today #2 by nurse visit in 3 months.   Return in about 6 months (around 04/22/2022) for cpe (20 min), Routine chronic condition follow-up.   Orders Placed This Encounter  Procedures   Varicella-zoster vaccine IM   Meds ordered this encounter  Medications   escitalopram (LEXAPRO) 10 MG tablet    Sig: Take 1 tablet (10 mg total) by mouth daily.    Dispense:  90 tablet    Refill:  1   Referral Orders  No referral(s) requested today     Electronically signed by: Felix Pacini, DO [facility] [place]

## 2021-12-26 ENCOUNTER — Encounter: Payer: Self-pay | Admitting: Family Medicine

## 2021-12-26 ENCOUNTER — Ambulatory Visit: Payer: No Typology Code available for payment source | Admitting: Family Medicine

## 2021-12-26 VITALS — BP 119/70 | HR 89 | Temp 98.5°F | Wt 169.4 lb

## 2021-12-26 DIAGNOSIS — R051 Acute cough: Secondary | ICD-10-CM | POA: Diagnosis not present

## 2021-12-26 DIAGNOSIS — J209 Acute bronchitis, unspecified: Secondary | ICD-10-CM

## 2021-12-26 LAB — POCT INFLUENZA A/B
Influenza A, POC: NEGATIVE
Influenza B, POC: NEGATIVE

## 2021-12-26 LAB — POCT RAPID STREP A (OFFICE): Rapid Strep A Screen: NEGATIVE

## 2021-12-26 LAB — POC COVID19 BINAXNOW: SARS Coronavirus 2 Ag: NEGATIVE

## 2021-12-26 MED ORDER — BENZONATATE 200 MG PO CAPS: 200.0000 mg | ORAL_CAPSULE | Freq: Two times a day (BID) | ORAL | 1 refills | Status: DC | PRN

## 2021-12-26 MED ORDER — DOXYCYCLINE HYCLATE 100 MG PO TABS: 100.0000 mg | ORAL_TABLET | Freq: Two times a day (BID) | ORAL | 0 refills | Status: DC

## 2021-12-26 NOTE — Patient Instructions (Addendum)
Return in about 2 weeks (around 01/09/2022), or if symptoms worsen or fail to improve.  Start over the counter mucinex DM (helps with phlegm and cough). Start allegra over the [employer] doxycyline (antibiotic) and completed full 10 day course.       Great to see you today.  I have refilled the medication(s) we provide.   If labs were collected, we will inform you of lab results once received either by echart message or telephone call.   - echart message- for normal results that have been seen by the patient already.   - telephone call: abnormal results or if patient has not viewed results in their echart.   Acute Bronchitis, Adult  Acute bronchitis is sudden inflammation of the main airways (bronchi) that come off the windpipe (trachea) in the lungs. The swelling causes the airways to get smaller and make more mucus than normal. This can make it hard to breathe and can cause coughing or noisy breathing (wheezing). Acute bronchitis may last several weeks. The cough may last longer. Allergies, asthma, and exposure to smoke may make the condition worse. What are the causes? This condition can be caused by germs and by substances that irritate the lungs, including: Cold and flu viruses. The most common cause of this condition is the virus that causes the common cold. Bacteria. This is less common. Breathing in substances that irritate the lungs, including: Smoke from cigarettes and other forms of tobacco. Dust and pollen. Fumes from household cleaning products, gases, or burned fuel. Indoor or outdoor air pollution. What increases the risk? The following factors may make you more likely to develop this condition: A weak body's defense system, also called the immune system. A condition that affects your lungs and breathing, such as asthma. What are the signs or symptoms? Common symptoms of this condition include: Coughing. This may bring up clear, yellow, or green mucus from your  lungs (sputum). Wheezing. Runny or stuffy nose. Having too much mucus in your lungs (chest congestion). Shortness of breath. Aches and pains, including sore throat or chest. How is this diagnosed? This condition is usually diagnosed based on: Your symptoms and medical history. A physical exam. You may also have other tests, including tests to rule out other conditions, such as pneumonia. These tests include: A test of lung function. Test of a mucus sample to look for the presence of bacteria. Tests to check the oxygen level in your blood. Blood tests. Chest X-ray. How is this treated? Most cases of acute bronchitis clear up over time without treatment. Your health care provider may recommend: Drinking more fluids to help thin your mucus so it is easier to cough up. Taking inhaled medicine (inhaler) to improve air flow in and out of your lungs. Using a vaporizer or a humidifier. These are machines that add water to the air to help you breathe better. Taking a medicine that thins mucus and clears congestion (expectorant). Taking a medicine that prevents or stops coughing (cough suppressant). It is not common to take an antibiotic medicine for this condition. Follow these instructions at home:  Take over-the-counter and prescription medicines only as told by your health care provider. Use an inhaler, vaporizer, or humidifier as told by your health care provider. Take two teaspoons (10 mL) of honey at bedtime to lessen coughing at night. Drink enough fluid to keep your urine pale yellow. Do not use any products that contain nicotine or tobacco. These products include cigarettes, chewing tobacco, and vaping devices,  such as e-cigarettes. If you need help quitting, ask your health care provider. Get plenty of rest. Return to your normal activities as told by your health care provider. Ask your health care provider what activities are safe for you. Keep all follow-up visits. This is  important. How is this prevented? To lower your risk of getting this condition again: Wash your hands often with soap and water for at least 20 seconds. If soap and water are not available, use hand sanitizer. Avoid contact with people who have cold symptoms. Try not to touch your mouth, nose, or eyes with your hands. Avoid breathing in smoke or chemical fumes. Breathing smoke or chemical fumes will make your condition worse. Get the flu shot every year. Contact a health care provider if: Your symptoms do not improve after 2 weeks. You have trouble coughing up the mucus. Your cough keeps you awake at night. You have a fever. Get help right away if you: Cough up blood. Feel pain in your chest. Have severe shortness of breath. Faint or keep feeling like you are going to faint. Have a severe headache. Have a fever or chills that get worse. These symptoms may represent a serious problem that is an emergency. Do not wait to see if the symptoms will go away. Get medical help right away. Call your local emergency services (911 in the [place] Do not drive yourself to the hospital. Summary Acute bronchitis is inflammation of the main airways (bronchi) that come off the windpipe (trachea) in the lungs. The swelling causes the airways to get smaller and make more mucus than normal. Drinking more fluids can help thin your mucus so it is easier to cough up. Take over-the-counter and prescription medicines only as told by your health care provider. Do not use any products that contain nicotine or tobacco. These products include cigarettes, chewing tobacco, and vaping devices, such as e-cigarettes. If you need help quitting, ask your health care provider. Contact a health care provider if your symptoms do not improve after 2 weeks. This information is not intended to replace advice given to you by your health care provider. Make sure you discuss any questions you have with your health care  provider. Document Revised: 05/10/2021 Document Reviewed: 05/31/2020 Elsevier Patient Education  2023 [employer].

## 2021-12-26 NOTE — Addendum Note (Signed)
Addended by: Filomena Jungling on: 12/26/2021 04:21 PM   Modules accepted: Orders

## 2021-12-26 NOTE — Progress Notes (Signed)
Nichole Ray , 09-29-1970, 51 y.o., female MRN: [id number] Patient Care Team    Relationship Specialty Notifications Start End  Natalia Leatherwood, DO PCP - General Family Medicine  02/20/21   Gelene Mink, OD Referring Physician Optometry  02/20/21     Chief Complaint  Patient presents with   Cough    1 month     Subjective: Pt presents for an OV with complaints of deep bronchial cough of 1 mo duration.   Seen at urgent care and treated with steroid taper and cough suppressants.  Better for about a week after steroids, then all symptoms started back.  She is feeling fatigued.     10/18/2021    9:23 AM 04/18/2021    9:36 AM 02/20/2021    2:00 PM  Depression screen PHQ 2/9  Decreased Interest 0 0 0  Down, Depressed, Hopeless 0 0 0  PHQ - 2 Score 0 0 0  Altered sleeping 0 1   Tired, decreased energy 1 1   Change in appetite 0 0   Feeling bad or failure about yourself  0 0   Trouble concentrating 0 0   Moving slowly or fidgety/restless 0 0   Suicidal thoughts 0 0   PHQ-9 Score 1 2     Allergies  Allergen Reactions   Penicillins Rash   Social History   Social History Narrative   Marital status/children/pets: Married   Education/employment: Bachelor's degree, employed.   Safety:      -smoke alarm in the home:Yes     - wears seatbelt: Yes     - Feels safe in their relationships: Yes      Past Medical History:  Diagnosis Date   Allergies    Migraines    Tuberculosis    UTI (urinary tract infection)    Past Surgical History:  Procedure Laterality Date   APPENDECTOMY  1990   Family History  Problem Relation Age of Onset   Hypertension Mother    Alcohol abuse Father    Asthma Paternal Grandfather    Arthritis Paternal Grandfather    Depression Daughter    Allergies as of 12/26/2021       Reactions   Penicillins Rash        Medication List        Accurate as of December 26, 2021  2:47 PM. If you have any questions, ask your nurse or doctor.           escitalopram 10 MG tablet Commonly known as: LEXAPRO Take 1 tablet (10 mg total) by mouth daily.        All past medical history, surgical history, allergies, family history, immunizations andmedications were updated in the EMR today and reviewed under the history and medication portions of their EMR.     ROS Negative, with the exception of above mentioned in HPI   Objective:  BP 119/70   Pulse 89   Temp 98.5 F (36.9 C)   Wt 169 lb 6.4 oz (76.8 kg)   SpO2 99%   BMI 27.14 kg/m  Body mass index is 27.14 kg/m. Physical Exam Vitals and nursing note reviewed.  Constitutional:      General: She is not in acute distress.    Appearance: Normal appearance. She is not ill-appearing, toxic-appearing or diaphoretic.  HENT:     Head: Normocephalic and atraumatic.  Eyes:     General: No scleral icterus.       Right eye: No  discharge.        Left eye: No discharge.     Extraocular Movements: Extraocular movements intact.     Conjunctiva/sclera: Conjunctivae normal.     Pupils: Pupils are equal, round, and reactive to light.  Cardiovascular:     Rate and Rhythm: Normal rate and regular rhythm.  Pulmonary:     Effort: Pulmonary effort is normal. No respiratory distress.     Breath sounds: Normal breath sounds. Decreased air movement present. No wheezing, rhonchi or rales.     Comments: Cough present Musculoskeletal:     Cervical back: Neck supple. No tenderness.  Lymphadenopathy:     Cervical: No cervical adenopathy.  Skin:    General: Skin is warm and dry.     Coloration: Skin is not jaundiced or pale.     Findings: No erythema or rash.  Neurological:     Mental Status: She is alert and oriented to person, place, and time. Mental status is at baseline.     Motor: No weakness.     Gait: Gait normal.  Psychiatric:        Mood and Affect: Mood normal.        Behavior: Behavior normal.        Thought Content: Thought content normal.        Judgment: Judgment  normal.    No results found. No results found. No results found for this or any previous visit (from the past 24 hour(s)).  Assessment/Plan: Nichole Ray is a 51 y.o. female present for OV for  Acute bronchitis with symptoms > 10 days/Acute cough Rest, hydrate.  mucinex (DM if cough), nettie pot or nasal saline.  Doxy BID prescribed, take until completed. Decreased air movmet> albuterol tx> improved.  Start OTC antihistamine.   If cough present it can last up to 6-8 weeks.  F/U 2 weeks if not improved.    Reviewed expectations re: course of current medical issues. Discussed self-management of symptoms. Outlined signs and symptoms indicating need for more acute intervention. Patient verbalized understanding and all questions were answered. Patient received an After-Visit Summary.    No orders of the defined types were placed in this encounter.  No orders of the defined types were placed in this encounter.  Referral Orders  No referral(s) requested today     Note is dictated utilizing voice recognition software. Although note has been proof read prior to signing, occasional typographical errors still can be missed. If any questions arise, please do not hesitate to call for verification.   electronically signed by:  Felix Pacini, DO  [facility] [place]

## 2021-12-27 ENCOUNTER — Telehealth: Payer: Self-pay

## 2021-12-27 NOTE — Telephone Encounter (Signed)
A user error has taken place: encounter opened in error, closed for administrative reasons.

## 2022-01-17 ENCOUNTER — Ambulatory Visit: Payer: No Typology Code available for payment source

## 2022-01-18 ENCOUNTER — Ambulatory Visit (INDEPENDENT_AMBULATORY_CARE_PROVIDER_SITE_OTHER): Payer: No Typology Code available for payment source

## 2022-01-18 DIAGNOSIS — Z23 Encounter for immunization: Secondary | ICD-10-CM | POA: Diagnosis not present

## 2022-01-18 NOTE — Progress Notes (Signed)
Pt presented for shingles vaccine #2. Pt tolerated injection well.

## 2022-03-05 ENCOUNTER — Ambulatory Visit: Payer: No Typology Code available for payment source | Admitting: Family Medicine

## 2022-03-05 ENCOUNTER — Encounter: Payer: Self-pay | Admitting: Family Medicine

## 2022-03-05 VITALS — BP 105/70 | HR 98 | Temp 98.0°F | Ht 66.25 in | Wt 171.0 lb

## 2022-03-05 DIAGNOSIS — M25562 Pain in left knee: Secondary | ICD-10-CM | POA: Diagnosis not present

## 2022-03-05 DIAGNOSIS — M79605 Pain in left leg: Secondary | ICD-10-CM | POA: Diagnosis not present

## 2022-03-05 MED ORDER — DICLOFENAC SODIUM 75 MG PO TBEC: 75.0000 mg | DELAYED_RELEASE_TABLET | Freq: Two times a day (BID) | ORAL | 2 refills | Status: DC

## 2022-03-05 NOTE — Progress Notes (Signed)
Nichole Ray , Apr 26, 1970, 52 y.o., female MRN: [id number] Patient Care Team    Relationship Specialty Notifications Start End  Ma Hillock, DO PCP - General Family Medicine  02/20/21   Camillo Flaming, [place] Referring Physician Optometry  02/20/21     Chief Complaint  Patient presents with   Leg Pain    Pt c/o L leg pain located worsen at knee x 3 mos; pt describe pain as shooting pain with limited ROM; denies swelling; has tried NSAIDS, and mild relief; worsen in last 3 days limited weightbaring      Subjective: Pt presents for an OV with complaints of left knee pain.  She states she has had knee pain for about 3 months, but the last 3 days it has significantly worsened.  Currently bearing weight, twisting motion and going downstairs causes increase in pain.  She does not recall a specific injury that initiated discomfort either a few months ago or a few days ago.  She states she works at a [employer] and works on her feet all day.  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  She is uncertain if possibly she twisted/injured herself.  She has been taking Advil before bed to help with discomfort and it seems to be working okay for her.  She states her knee has not swollen or become red.  She denies any fever.     03/05/2022    3:26 PM 10/18/2021    9:23 AM 04/18/2021    9:36 AM 02/20/2021    2:00 PM  Depression screen PHQ 2/9  Decreased Interest 0 0 0 0  Down, Depressed, Hopeless 0 0 0 0  PHQ - 2 Score 0 0 0 0  Altered sleeping  0 1   Tired, decreased energy  1 1   Change in appetite  0 0   Feeling bad or failure about yourself   0 0   Trouble concentrating  0 0   Moving slowly or fidgety/restless  0 0   Suicidal thoughts  0 0   PHQ-9 Score  1 2     Allergies  Allergen Reactions   Penicillins Rash   Social History   Social History Narrative   Marital status/children/pets: Married   Education/employment: Bachelor's degree, employed.    Safety:      -smoke alarm in the home:Yes     - wears seatbelt: Yes     - Feels safe in their relationships: Yes      Past Medical History:  Diagnosis Date   Allergies    Migraines    Tuberculosis    UTI (urinary tract infection)    Past Surgical History:  Procedure Laterality Date   APPENDECTOMY  1990   Family History  Problem Relation Age of Onset   Hypertension Mother    Alcohol abuse Father    Asthma Paternal Grandfather    Arthritis Paternal Grandfather    Depression Daughter    Allergies as of 03/05/2022       Reactions   Penicillins Rash        Medication List        Accurate as of March 05, 2022  4:41 PM. If you have any questions, ask your nurse or doctor.          benzonatate 200 MG capsule Commonly known as: TESSALON Take 1 capsule (200 mg total) by mouth 2 (two) times daily as needed for cough.  diclofenac 75 MG EC tablet Commonly known as: VOLTAREN Take 1 tablet (75 mg total) by mouth 2 (two) times daily. Started by: Felix Pacini, DO   doxycycline 100 MG tablet Commonly known as: VIBRA-TABS Take 1 tablet (100 mg total) by mouth 2 (two) times daily.   escitalopram 10 MG tablet Commonly known as: LEXAPRO Take 1 tablet (10 mg total) by mouth daily.        All past medical history, surgical history, allergies, family history, immunizations andmedications were updated in the EMR today and reviewed under the history and medication portions of their EMR.     ROS Negative, with the exception of above mentioned in HPI   Objective:  BP 105/70   Pulse 98   Temp 98 F (36.7 C) (Oral)   Ht 5' 6.25" (1.683 m)   Wt 171 lb (77.6 kg)   SpO2 97%   BMI 27.39 kg/m  Body mass index is 27.39 kg/m.  Physical Exam Vitals and nursing note reviewed.  Constitutional:      General: She is not in acute distress.    Appearance: Normal appearance. She is normal weight. She is not ill-appearing or toxic-appearing.  HENT:     Head: Normocephalic  and atraumatic.  Eyes:     General: No scleral icterus.       Right eye: No discharge.        Left eye: No discharge.     Extraocular Movements: Extraocular movements intact.     Conjunctiva/sclera: Conjunctivae normal.     Pupils: Pupils are equal, round, and reactive to light.  Musculoskeletal:        General: Swelling and tenderness present.     Right knee: Normal.     Left knee: Swelling, effusion and crepitus present. No erythema or ecchymosis. Decreased range of motion. Tenderness present over the lateral joint line and LCL.     Instability Tests: Anterior drawer test negative. Posterior drawer test negative. Medial McMurray test positive and lateral McMurray test positive.       Legs:  Skin:    Findings: No rash.  Neurological:     Mental Status: She is alert and oriented to person, place, and time. Mental status is at baseline.     Motor: No weakness.     Coordination: Coordination normal.     Gait: Gait normal.  Psychiatric:        Mood and Affect: Mood normal.        Behavior: Behavior normal.        Thought Content: Thought content normal.        Judgment: Judgment normal.    No results found. No results found. No results found for this or any previous visit (from the past 24 hour(s)).  Assessment/Plan: Nichole Ray is a 52 y.o. female present for OV for  Acute pain of left knee Suspect lateral meniscal injury, cannot rule out LCL injury as well.  Knee is not unstable at this time.  Painful with twisting range of motion. Provided patient with knee sleeve today and encouraged her to purchase a neoprene knee sleeve. Avoid twisting motion and extra caution with stairs.   Elected to refer to orthopedics for further eval, Keep knee elevated and ice when able. Diclofenac twice daily with food - Ambulatory referral to Orthopedic Surgery  Reviewed expectations re: course of current medical issues. Discussed self-management of symptoms. Outlined signs and symptoms  indicating need for more acute intervention. Patient verbalized understanding and all questions were  answered. Patient received an After-Visit Summary.    Orders Placed This Encounter  Procedures   Ambulatory referral to Orthopedic Surgery   Meds ordered this encounter  Medications   diclofenac (VOLTAREN) 75 MG EC tablet    Sig: Take 1 tablet (75 mg total) by mouth 2 (two) times daily.    Dispense:  60 tablet    Refill:  2   Referral Orders         Ambulatory referral to Orthopedic Surgery       Note is dictated utilizing voice recognition software. Although note has been proof read prior to signing, occasional typographical errors still can be missed. If any questions arise, please do not hesitate to call for verification.   electronically signed by:  Howard Pouch, DO  [place]

## 2022-03-05 NOTE — Patient Instructions (Signed)
I believe you have either strained a ligament in your knee or injured your meniscus.   I have called in diclofenac and referred you to orthopedics.   I recommend you purchase a neoprene knee sleeve for support and wear all day (except showers) until you can be evaluated by orthopedics.

## 2022-03-25 ENCOUNTER — Ambulatory Visit (HOSPITAL_BASED_OUTPATIENT_CLINIC_OR_DEPARTMENT_OTHER)
Admission: RE | Admit: 2022-03-25 | Discharge: 2022-03-25 | Disposition: A | Discharge status: Home / Self Care | Payer: No Typology Code available for payment source | Source: Ambulatory Visit | Attending: Family Medicine | Admitting: Family Medicine

## 2022-03-25 ENCOUNTER — Encounter (HOSPITAL_BASED_OUTPATIENT_CLINIC_OR_DEPARTMENT_OTHER): Payer: Self-pay

## 2022-03-25 DIAGNOSIS — Z1231 Encounter for screening mammogram for malignant neoplasm of breast: Secondary | ICD-10-CM | POA: Diagnosis present

## 2022-04-25 ENCOUNTER — Encounter: Payer: No Typology Code available for payment source | Admitting: Family Medicine

## 2022-04-29 ENCOUNTER — Telehealth (INDEPENDENT_AMBULATORY_CARE_PROVIDER_SITE_OTHER): Payer: No Typology Code available for payment source | Admitting: Family Medicine

## 2022-04-29 ENCOUNTER — Encounter: Payer: Self-pay | Admitting: Family Medicine

## 2022-04-29 DIAGNOSIS — F418 Other specified anxiety disorders: Secondary | ICD-10-CM

## 2022-04-29 DIAGNOSIS — R232 Flushing: Secondary | ICD-10-CM

## 2022-04-29 MED ORDER — ESCITALOPRAM OXALATE 10 MG PO TABS: 10.0000 mg | ORAL_TABLET | Freq: Every day | ORAL | 1 refills | Status: DC

## 2022-04-29 NOTE — Progress Notes (Signed)
VIRTUAL VISIT VIA VIDEO  I connected with Nichole Ray on 04/29/22 at  1:00 PM EDT by a video enabled telemedicine application and verified that I am speaking with the correct person using two identifiers. Location patient: Home Location provider: [facility], Office Persons participating in the virtual visit: Patient, Dr. Raoul Pitch and Marlou Porch, CMA  I discussed the limitations of evaluation and management by telemedicine and the availability of in person appointments. The patient expressed understanding and agreed to proceed.     Patient ID: Nichole Ray, female  DOB: 1970/05/22, 52 y.o.   MRN: [id number] Patient Care Team    Relationship Specialty Notifications Start End  Ma Hillock, DO PCP - General Family Medicine  02/20/21   Camillo Flaming, [place] Referring Physician Optometry  02/20/21     Chief Complaint  Patient presents with   Anxiety    Subjective: Nichole Ray is a 52 y.o.  Female  present for cmc. All past medical history, surgical history, allergies, family history, immunizations, medications and social history were updated in the electronic medical record today. All recent labs, ED visits and hospitalizations within the last year were reviewed.  Anxiousness/hot flashes: She reports compliance with Lexapro 10 mg daily and feels it is working well for her stress and hot flashes.   Prior note: Patient reports she has some anxiety surrounding her daughter's mental health.  This is causing her more anxiousness.  She also is in perimenopausal state and noticing hot flashes.  Lexapro 10mg  was started about 7 weeks ago and patient reports she is better with anxiety and hotflashes     04/29/2022    1:03 PM 03/05/2022    3:26 PM 10/18/2021    9:23 AM 04/18/2021    9:36 AM 02/20/2021    2:00 PM  Depression screen PHQ 2/9  Decreased Interest 0 0 0 0 0  Down, Depressed, Hopeless 0 0 0 0 0  PHQ - 2 Score 0 0 0 0 0  Altered sleeping   0 1   Tired, decreased energy    1 1   Change in appetite   0 0   Feeling bad or failure about yourself    0 0   Trouble concentrating   0 0   Moving slowly or fidgety/restless   0 0   Suicidal thoughts   0 0   PHQ-9 Score   1 2       04/29/2022    1:03 PM 10/18/2021    9:23 AM 04/18/2021    9:36 AM  GAD 7 : Generalized Anxiety Score  Nervous, Anxious, on Edge 0 0 0  Control/stop worrying 0 0 0  Worry too much - different things 0 0 1  Trouble relaxing 0 0 0  Restless 0 0 0  Easily annoyed or irritable 0 0 0  Afraid - awful might happen 0 0 1  Total GAD 7 Score 0 0 2    Immunization History  Administered Date(s) Administered   Influenza Split 12/15/2018   Influenza,inj,Quad PF,6+ Mos 11/18/2017, 02/20/2021   Influenza,inj,quad, With Preservative 01/24/2015   Influenza-Unspecified 11/22/2015, 11/11/2021   Moderna SARS-COV2 Booster Vaccination 01/08/2020   Moderna Sars-Covid-2 Vaccination 04/19/2019, 05/17/2019   Td 04/25/2009   Tdap 02/12/2020   Zoster Recombinat (Shingrix) 10/18/2021, 01/18/2022     Past Medical History:  Diagnosis Date   Allergies    Migraines    Tuberculosis    UTI (urinary tract infection)  Allergies  Allergen Reactions   Penicillins Rash   Past Surgical History:  Procedure Laterality Date   APPENDECTOMY  1990   Family History  Problem Relation Age of Onset   Hypertension Mother    Alcohol abuse Father    Asthma Paternal Grandfather    Arthritis Paternal Grandfather    Depression Daughter    Social History   Social History Narrative   Marital status/children/pets: Married   Education/employment: Bachelor's degree, employed.   Safety:      -smoke alarm in the home:Yes     - wears seatbelt: Yes     - Feels safe in their relationships: Yes       Allergies as of 04/29/2022       Reactions   Penicillins Rash        Medication List        Accurate as of April 29, 2022  1:10 PM. If you have any questions, ask your nurse or doctor.          STOP  taking these medications    benzonatate 200 MG capsule Commonly known as: TESSALON Stopped by: Howard Pouch, DO   doxycycline 100 MG tablet Commonly known as: VIBRA-TABS Stopped by: Howard Pouch, DO       TAKE these medications    diclofenac 75 MG EC tablet Commonly known as: VOLTAREN Take 1 tablet (75 mg total) by mouth 2 (two) times daily.   escitalopram 10 MG tablet Commonly known as: LEXAPRO Take 1 tablet (10 mg total) by mouth daily.        All past medical history, surgical history, allergies, family history, immunizations andmedications were updated in the EMR today and reviewed under the history and medication portions of their EMR.     No results found for this or any previous visit (from the past 2160 hour(s)).   ROS 14 pt review of systems performed and negative (unless mentioned in an HPI)  Objective: There were no vitals taken for this visit. Physical Exam Vitals and nursing note reviewed.  Constitutional:      General: She is not in acute distress.    Appearance: Normal appearance. She is not toxic-appearing.  HENT:     Head: Normocephalic and atraumatic.  Eyes:     General: No scleral icterus.       Right eye: No discharge.        Left eye: No discharge.     Conjunctiva/sclera: Conjunctivae normal.  Pulmonary:     Effort: Pulmonary effort is normal.  Musculoskeletal:     Cervical back: Normal range of motion.  Skin:    Findings: No rash.  Neurological:     Mental Status: She is alert and oriented to person, place, and time. Mental status is at baseline.  Psychiatric:        Mood and Affect: Mood normal.        Behavior: Behavior normal.        Thought Content: Thought content normal.        Judgment: Judgment normal.       No results found.  Assessment/plan: Nichole Ray is a 52 y.o. female present for [place] anxiety/Hot flashes Stable Continue lexapro 10 mg qd  Return in about 24 weeks (around 10/14/2022).   No  orders of the defined types were placed in this encounter.  Meds ordered this encounter  Medications   escitalopram (LEXAPRO) 10 MG tablet    Sig: Take 1 tablet (10 mg total) by  mouth daily.    Dispense:  90 tablet    Refill:  1   Referral Orders  No referral(s) requested today     Electronically signed by: Howard Pouch, [place]

## 2022-04-29 NOTE — Patient Instructions (Addendum)
Return in about 24 weeks (around 10/14/2022).        Great to see you today.  I have refilled the medication(s) we provide.   If labs were collected, we will inform you of lab results once received either by echart message or telephone call.   - echart message- for normal results that have been seen by the patient already.   - telephone call: abnormal results or if patient has not viewed results in their echart.

## 2022-05-28 ENCOUNTER — Ambulatory Visit (INDEPENDENT_AMBULATORY_CARE_PROVIDER_SITE_OTHER): Payer: No Typology Code available for payment source | Admitting: Family Medicine

## 2022-05-28 ENCOUNTER — Encounter: Payer: Self-pay | Admitting: Family Medicine

## 2022-05-28 VITALS — BP 105/71 | HR 65 | Temp 97.9°F | Ht 66.0 in | Wt 175.8 lb

## 2022-05-28 DIAGNOSIS — E559 Vitamin D deficiency, unspecified: Secondary | ICD-10-CM | POA: Diagnosis not present

## 2022-05-28 DIAGNOSIS — Z0000 Encounter for general adult medical examination without abnormal findings: Secondary | ICD-10-CM | POA: Diagnosis not present

## 2022-05-28 DIAGNOSIS — Z1231 Encounter for screening mammogram for malignant neoplasm of breast: Secondary | ICD-10-CM

## 2022-05-28 DIAGNOSIS — Z131 Encounter for screening for diabetes mellitus: Secondary | ICD-10-CM

## 2022-05-28 DIAGNOSIS — Z13220 Encounter for screening for lipoid disorders: Secondary | ICD-10-CM

## 2022-05-28 NOTE — Patient Instructions (Addendum)
Return in about 1 year (around 05/29/2023) for cpe (20 min).        Great to see you today.  I have refilled the medication(s) we provide.   If labs were collected, we will inform you of lab results once received either by echart message or telephone call.   - echart message- for normal results that have been seen by the patient already.   - telephone call: abnormal results or if patient has not viewed results in their echart.

## 2022-05-28 NOTE — Progress Notes (Signed)
Patient ID: Nichole Ray, female  DOB: [birth year], 52 y.o.   MRN: [id number] Patient Care Team    Relationship Specialty Notifications Start End  Natalia Leatherwood, DO PCP - General Family Medicine  02/20/21   Gelene Mink, OD Referring Physician Optometry  02/20/21     Chief Complaint  Patient presents with   Annual Exam    Subjective: Nichole Ray is a 52 y.o.  Female  present for cpe All past medical history, surgical history, allergies, family history, immunizations, medications and social history were updated in the electronic medical record today. All recent labs, ED visits and hospitalizations within the last year were reviewed.  Health maintenance:  Colonoscopy: no fhx. Cologuard normal 05/04/2021 Mammogram: completed:03/25/2022, [facility] Cervical cancer screening: last pap: 2021-GYN Immunizations: tdap UTD 2022, Influenza UTD 02/2021 (encouraged yearly), shingrix series completed Infectious disease screening: HIV and Hep C completed DEXA: routine screen Patient has a Dental home. Hospitalizations/ED visits: reviewed     05/28/2022    1:59 PM 04/29/2022    1:03 PM 03/05/2022    3:26 PM 10/18/2021    9:23 AM 04/18/2021    9:36 AM  Depression screen PHQ 2/9  Decreased Interest 0 0 0 0 0  Down, Depressed, Hopeless 0 0 0 0 0  PHQ - 2 Score 0 0 0 0 0  Altered sleeping 0   0 1  Tired, decreased energy 0   1 1  Change in appetite 0   0 0  Feeling bad or failure about yourself  0   0 0  Trouble concentrating 0   0 0  Moving slowly or fidgety/restless 0   0 0  Suicidal thoughts 0   0 0  PHQ-9 Score 0   1 2      04/29/2022    1:03 PM 10/18/2021    9:23 AM 04/18/2021    9:36 AM  GAD 7 : Generalized Anxiety Score  Nervous, Anxious, on Edge 0 0 0  Control/stop worrying 0 0 0  Worry too much - different things 0 0 1  Trouble relaxing 0 0 0  Restless 0 0 0  Easily annoyed or irritable 0 0 0  Afraid - awful might happen 0 0 1  Total GAD 7 Score 0 0 2    Immunization  History  Administered Date(s) Administered   Influenza Split 12/15/2018   Influenza,inj,Quad PF,6+ Mos 11/18/2017, 02/20/2021   Influenza,inj,quad, With Preservative 01/24/2015   Influenza-Unspecified 11/22/2015, 11/11/2021   Moderna SARS-COV2 Booster Vaccination 01/08/2020   Moderna Sars-Covid-2 Vaccination 04/19/2019, 05/17/2019   Td 04/25/2009   Tdap 02/12/2020   Zoster Recombinat (Shingrix) 10/18/2021, 01/18/2022   Past Medical History:  Diagnosis Date   Allergies    Migraines    Tuberculosis    UTI (urinary tract infection)    Allergies  Allergen Reactions   Penicillins Rash   Past Surgical History:  Procedure Laterality Date   APPENDECTOMY  1990   Family History  Problem Relation Age of Onset   Hypertension Mother    Alcohol abuse Father    Asthma Paternal Grandfather    Arthritis Paternal Grandfather    Depression Daughter    Social History   Social History Narrative   Marital status/children/pets: Married   Education/employment: Bachelor's degree, employed.   Safety:      -smoke alarm in the home:Yes     - wears seatbelt: Yes     - Feels safe in their relationships: Yes  Allergies as of 05/28/2022       Reactions   Penicillins Rash        Medication List        Accurate as of May 28, 2022  2:28 PM. If you have any questions, ask your nurse or doctor.          diclofenac 75 MG EC tablet Commonly known as: VOLTAREN Take 1 tablet (75 mg total) by mouth 2 (two) times daily. What changed:  when to take this reasons to take this   escitalopram 10 MG tablet Commonly known as: LEXAPRO Take 1 tablet (10 mg total) by mouth daily.        All past medical history, surgical history, allergies, family history, immunizations andmedications were updated in the EMR today and reviewed under the history and medication portions of their EMR.     No results found for this or any previous visit (from the past 2160 hour(s)).    ROS 14 pt  review of systems performed and negative (unless mentioned in an HPI)  Objective: BP 105/71   Pulse 65   Temp 97.9 F (36.6 C)   Ht  (1.676 m)   Wt 175 lb 12.8 oz (79.7 kg)   SpO2 98%   BMI 28.37 kg/m  Physical Exam Vitals and nursing note reviewed.  Constitutional:      General: She is not in acute distress.    Appearance: Normal appearance. She is not ill-appearing or toxic-appearing.  HENT:     Head: Normocephalic and atraumatic.     Right Ear: Tympanic membrane, ear canal and external ear normal. There is no impacted cerumen.     Left Ear: Tympanic membrane, ear canal and external ear normal. There is no impacted cerumen.     Nose: No congestion or rhinorrhea.     Mouth/Throat:     Mouth: Mucous membranes are moist.     Pharynx: Oropharynx is clear. No oropharyngeal exudate or posterior oropharyngeal erythema.  Eyes:     General: No scleral icterus.       Right eye: No discharge.        Left eye: No discharge.     Extraocular Movements: Extraocular movements intact.     Conjunctiva/sclera: Conjunctivae normal.     Pupils: Pupils are equal, round, and reactive to light.  Cardiovascular:     Rate and Rhythm: Normal rate and regular rhythm.     Pulses: Normal pulses.     Heart sounds: Normal heart sounds. No murmur heard.    No friction rub. No gallop.  Pulmonary:     Effort: Pulmonary effort is normal. No respiratory distress.     Breath sounds: Normal breath sounds. No stridor. No wheezing, rhonchi or rales.  Chest:     Chest wall: No tenderness.  Abdominal:     General: Abdomen is flat. Bowel sounds are normal. There is no distension.     Palpations: Abdomen is soft. There is no mass.     Tenderness: There is no abdominal tenderness. There is no right CVA tenderness, left CVA tenderness, guarding or rebound.     Hernia: No hernia is present.  Musculoskeletal:        General: No swelling, tenderness or deformity. Normal range of motion.     Cervical back:  Normal range of motion and neck supple. No rigidity or tenderness.     Right lower leg: No edema.     Left lower leg: No edema.  Lymphadenopathy:  Cervical: No cervical adenopathy.  Skin:    General: Skin is warm and dry.     Coloration: Skin is not jaundiced or pale.     Findings: No bruising, erythema, lesion or rash.  Neurological:     General: No focal deficit present.     Mental Status: She is alert and oriented to person, place, and time. Mental status is at baseline.     Cranial Nerves: No cranial nerve deficit.     Sensory: No sensory deficit.     Motor: No weakness.     Coordination: Coordination normal.     Gait: Gait normal.     Deep Tendon Reflexes: Reflexes normal.  Psychiatric:        Mood and Affect: Mood normal.        Behavior: Behavior normal.        Thought Content: Thought content normal.        Judgment: Judgment normal.      No results found.  Assessment/plan: Nichole Ray is a 52 y.o. female present for CPE Vitamin D deficiency - VITAMIN D 25 Hydroxy (Vit-D Deficiency, Fractures) Breast cancer screening by mammogram - MM 3D SCREENING MAMMOGRAM BILATERAL BREAST; Future Lipid screening - Lipid panel Diabetes mellitus screening - Hemoglobin A1c Routine general medical examination at a health care facility Colonoscopy: no fhx. Cologuard normal 05/04/2021 Mammogram: completed:03/25/2022, [facility] Cervical cancer screening: last pap: 2021-GYN Immunizations: tdap UTD 2022, Influenza UTD 02/2021 (encouraged yearly), shingrix series completed Infectious disease screening: HIV and Hep C completed DEXA: routine screen Patient was encouraged to exercise greater than 150 minutes a week. Patient was encouraged to choose a diet filled with fresh fruits and vegetables, and lean meats. AVS provided to patient today for education/recommendation on gender specific health and safety maintenance. - CBC with Differential/Platelet - Comprehensive metabolic  panel - TSH  Return in about 1 year (around 05/29/2023) for cpe (20 min).  Orders Placed This Encounter  Procedures   MM 3D SCREENING MAMMOGRAM BILATERAL BREAST   CBC with Differential/Platelet   Comprehensive metabolic panel   Hemoglobin A1c   Lipid panel   TSH   Vitamin D (25 hydroxy)   No orders of the defined types were placed in this encounter.  Referral Orders  No referral(s) requested today     Electronically signed by: Felix Pacini, DO [facility]

## 2022-05-29 LAB — COMPREHENSIVE METABOLIC PANEL
AG Ratio: 1.7 (calc) (ref 1.0–2.5)
ALT: 31 U/L — ABNORMAL HIGH (ref 6–29)
AST: 20 U/L (ref 10–35)
Albumin: 4.1 g/dL (ref 3.6–5.1)
Alkaline phosphatase (APISO): 57 U/L (ref 37–153)
BUN: 18 mg/dL (ref 7–25)
CO2: 25 mmol/L (ref 20–32)
Calcium: 8.9 mg/dL (ref 8.6–10.4)
Chloride: 107 mmol/L (ref 98–110)
Creat: 0.84 mg/dL (ref 0.50–1.03)
Globulin: 2.4 g/dL (calc) (ref 1.9–3.7)
Glucose, Bld: 85 mg/dL (ref 65–99)
Potassium: 3.8 mmol/L (ref 3.5–5.3)
Sodium: 142 mmol/L (ref 135–146)
Total Bilirubin: 0.4 mg/dL (ref 0.2–1.2)
Total Protein: 6.5 g/dL (ref 6.1–8.1)

## 2022-05-29 LAB — CBC WITH DIFFERENTIAL/PLATELET
Absolute Monocytes: 561 cells/uL (ref 200–950)
Basophils Absolute: 40 cells/uL (ref 0–200)
Basophils Relative: 0.6 %
Eosinophils Absolute: 99 cells/uL (ref 15–500)
Eosinophils Relative: 1.5 %
HCT: 39.2 % (ref 35.0–45.0)
Hemoglobin: 13.3 g/dL (ref 11.7–15.5)
Lymphs Abs: 1835 cells/uL (ref 850–3900)
MCH: 31.3 pg (ref 27.0–33.0)
MCHC: 33.9 g/dL (ref 32.0–36.0)
MCV: 92.2 fL (ref 80.0–100.0)
MPV: 11.4 fL (ref 7.5–12.5)
Monocytes Relative: 8.5 %
Neutro Abs: 4066 cells/uL (ref 1500–7800)
Neutrophils Relative %: 61.6 %
Platelets: 329 10*3/uL (ref 140–400)
RBC: 4.25 10*6/uL (ref 3.80–5.10)
RDW: 12.7 % (ref 11.0–15.0)
Total Lymphocyte: 27.8 %
WBC: 6.6 10*3/uL (ref 3.8–10.8)

## 2022-05-29 LAB — VITAMIN D 25 HYDROXY (VIT D DEFICIENCY, FRACTURES): Vit D, 25-Hydroxy: 32 ng/mL (ref 30–100)

## 2022-05-29 LAB — LIPID PANEL
Cholesterol: 203 mg/dL — ABNORMAL HIGH (ref <200)
HDL: 44 mg/dL — ABNORMAL LOW (ref >=50)
LDL Cholesterol (Calc): 131 mg/dL (calc) — ABNORMAL HIGH
Non-HDL Cholesterol (Calc): 159 mg/dL (calc) — ABNORMAL HIGH (ref <130)
Total CHOL/HDL Ratio: 4.6 (calc) (ref <5.0)
Triglycerides: 160 mg/dL — ABNORMAL HIGH (ref <150)

## 2022-05-29 LAB — HEMOGLOBIN A1C
Hgb A1c MFr Bld: 5.6 % of total Hgb (ref <5.7)
Mean Plasma Glucose: 114 mg/dL
eAG (mmol/L): 6.3 mmol/L

## 2022-05-29 LAB — TSH: TSH: 0.88 mIU/L

## 2022-10-30 ENCOUNTER — Other Ambulatory Visit: Payer: Self-pay | Admitting: Family Medicine

## 2022-12-18 IMAGING — MG MM DIGITAL SCREENING BILAT W/ TOMO AND CAD
8 series · 8 of 24 positions shown · non-contrast
Comparison: Previous exam(s).

CLINICAL DATA: Screening.

EXAM:
DIGITAL SCREENING BILATERAL MAMMOGRAM WITH TOMOSYNTHESIS AND CAD
TECHNIQUE: Bilateral screening digital craniocaudal and mediolateral oblique
mammograms were obtained. Bilateral screening digital breast
tomosynthesis was performed. The images were evaluated with
computer-aided detection.

[L MLO synth-2D]
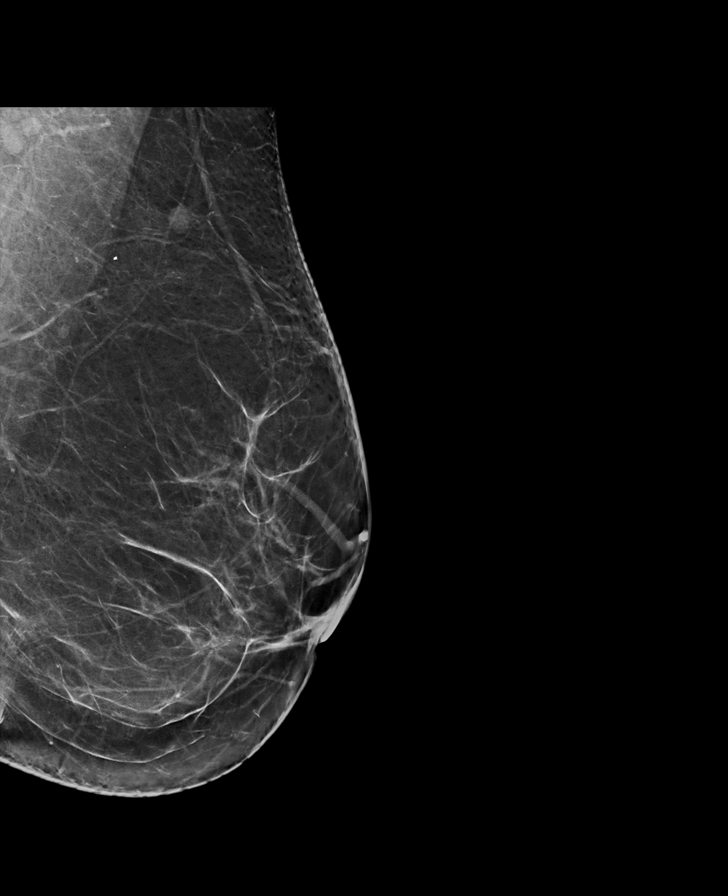

[L CC synth-2D]
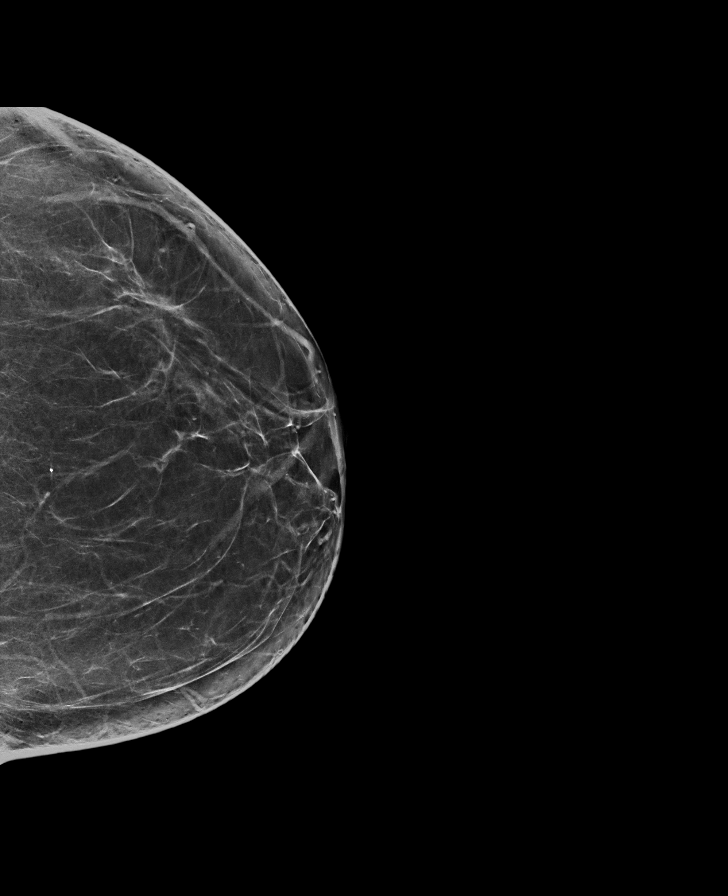

[R MLO synth-2D]
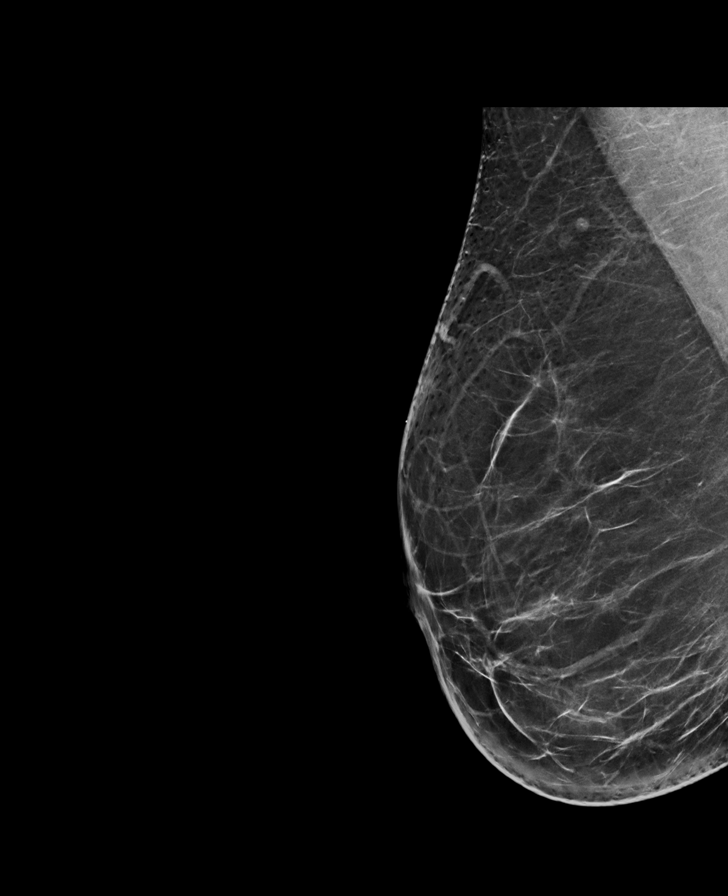

[R CC synth-2D]
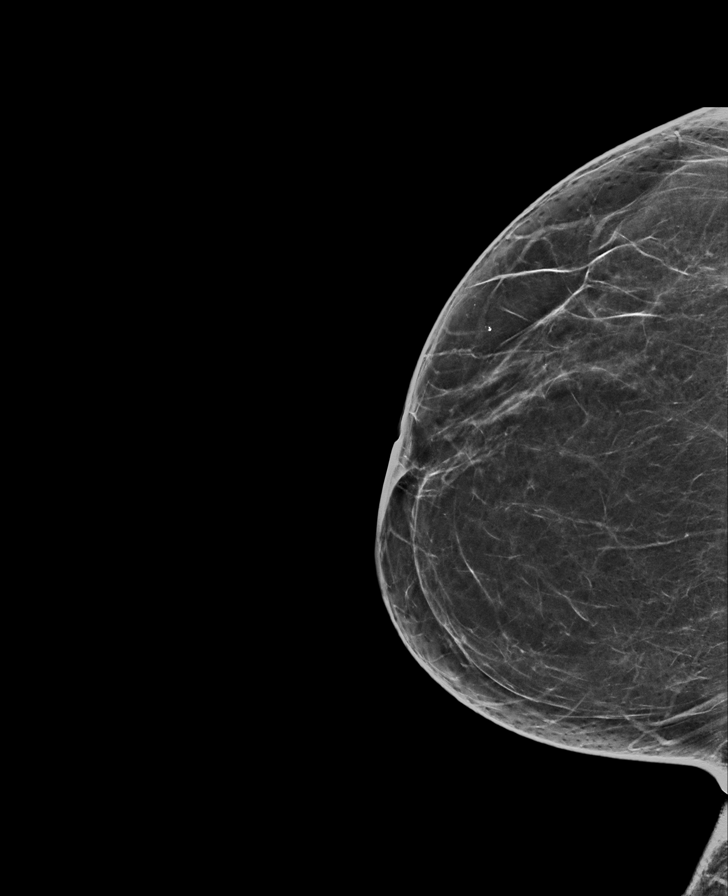

[L MLO tomo · tomo slice 39/78.0]
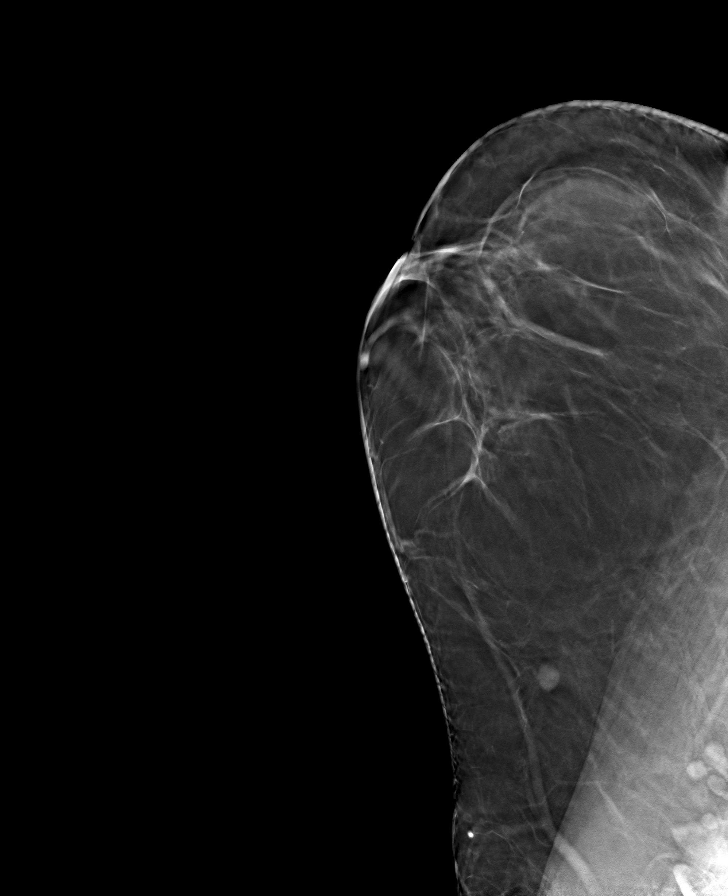

[R MLO tomo · tomo slice 41/80.0]
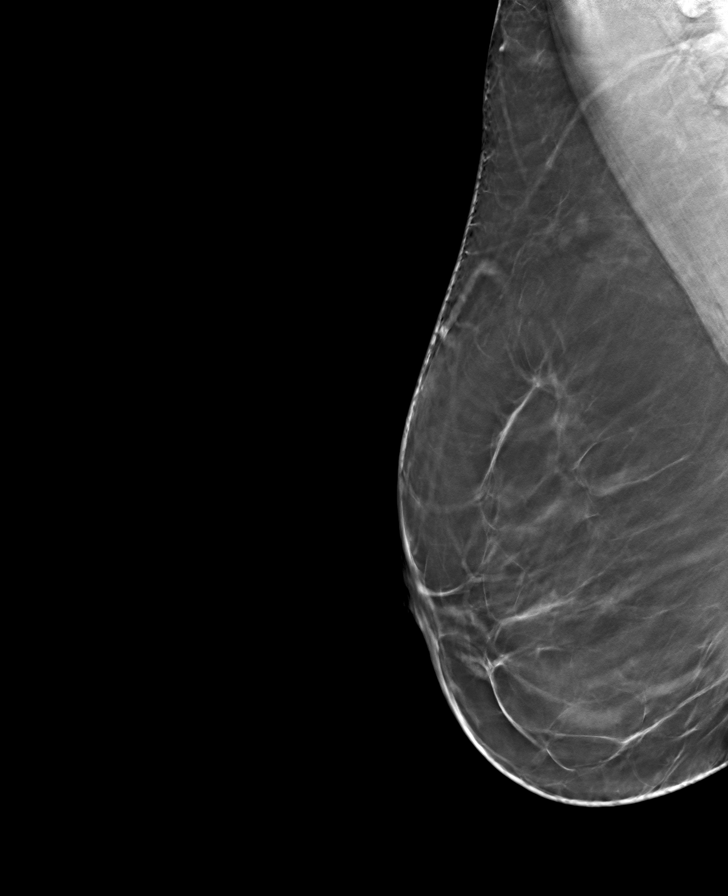

[R CC tomo · tomo slice 35/68.0]
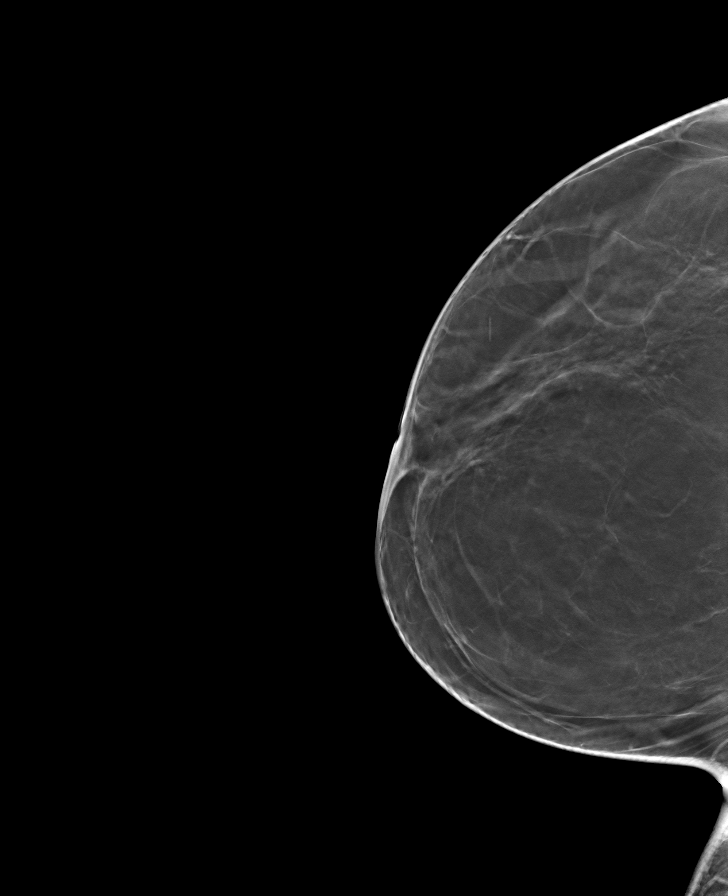

[L CC tomo · tomo slice 37/74.0]
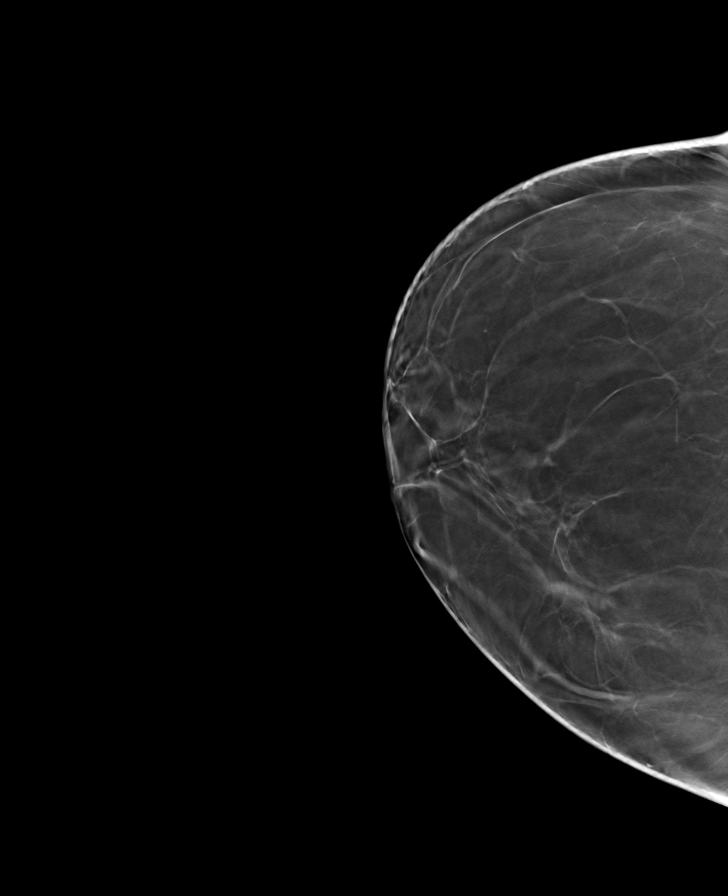

[8 of 24 positions shown; findings below may reference images not displayed]

ACR Breast Density Category b: There are scattered areas of
fibroglandular density.
FINDINGS: There are no findings suspicious for malignancy.
IMPRESSION: No mammographic evidence of malignancy. A result letter of this
screening mammogram will be mailed directly to the patient.

RECOMMENDATION:
Screening mammogram in one year. (Code:51-O-LD2)

BI-RADS CATEGORY  1: Negative.

## 2023-03-31 ENCOUNTER — Ambulatory Visit (HOSPITAL_BASED_OUTPATIENT_CLINIC_OR_DEPARTMENT_OTHER)
Admission: RE | Admit: 2023-03-31 | Discharge: 2023-03-31 | Disposition: A | Discharge status: Home / Self Care | Payer: 59 | Source: Ambulatory Visit | Attending: Family Medicine | Admitting: Family Medicine

## 2023-03-31 DIAGNOSIS — Z1231 Encounter for screening mammogram for malignant neoplasm of breast: Secondary | ICD-10-CM | POA: Diagnosis present

## 2023-04-04 ENCOUNTER — Encounter: Payer: Self-pay | Admitting: Family Medicine

## 2023-05-13 ENCOUNTER — Other Ambulatory Visit: Payer: Self-pay | Admitting: Family Medicine

## 2023-06-02 ENCOUNTER — Encounter: Payer: No Typology Code available for payment source | Admitting: Family Medicine

## 2023-06-06 ENCOUNTER — Encounter: Payer: No Typology Code available for payment source | Admitting: Family Medicine

## 2023-06-12 ENCOUNTER — Encounter: Admitting: Family Medicine

## 2023-06-14 ENCOUNTER — Other Ambulatory Visit: Payer: Self-pay | Admitting: Family Medicine

## 2023-07-15 ENCOUNTER — Encounter: Payer: Self-pay | Admitting: Family Medicine

## 2023-07-15 ENCOUNTER — Ambulatory Visit (INDEPENDENT_AMBULATORY_CARE_PROVIDER_SITE_OTHER): Admitting: Family Medicine

## 2023-07-15 VITALS — BP 106/76 | HR 81 | Temp 97.9°F | Ht 66.0 in | Wt 174.0 lb

## 2023-07-15 DIAGNOSIS — Z13220 Encounter for screening for lipoid disorders: Secondary | ICD-10-CM | POA: Diagnosis not present

## 2023-07-15 DIAGNOSIS — M1990 Unspecified osteoarthritis, unspecified site: Secondary | ICD-10-CM | POA: Diagnosis not present

## 2023-07-15 DIAGNOSIS — Z131 Encounter for screening for diabetes mellitus: Secondary | ICD-10-CM

## 2023-07-15 DIAGNOSIS — G479 Sleep disorder, unspecified: Secondary | ICD-10-CM | POA: Diagnosis not present

## 2023-07-15 DIAGNOSIS — E663 Overweight: Secondary | ICD-10-CM | POA: Diagnosis not present

## 2023-07-15 DIAGNOSIS — Z0000 Encounter for general adult medical examination without abnormal findings: Secondary | ICD-10-CM

## 2023-07-15 DIAGNOSIS — F418 Other specified anxiety disorders: Secondary | ICD-10-CM | POA: Diagnosis not present

## 2023-07-15 DIAGNOSIS — E559 Vitamin D deficiency, unspecified: Secondary | ICD-10-CM

## 2023-07-15 MED ORDER — DICLOFENAC SODIUM 75 MG PO TBEC: 75.0000 mg | DELAYED_RELEASE_TABLET | Freq: Two times a day (BID) | ORAL | 5 refills | Status: DC

## 2023-07-15 MED ORDER — TRAZODONE HCL 50 MG PO TABS: 25.0000 mg | ORAL_TABLET | Freq: Every evening | ORAL | 5 refills | Status: AC | PRN

## 2023-07-15 MED ORDER — ESCITALOPRAM OXALATE 10 MG PO TABS: 10.0000 mg | ORAL_TABLET | Freq: Every day | ORAL | 1 refills | Status: DC

## 2023-07-15 NOTE — Patient Instructions (Addendum)
 Return in about 24 weeks (around 12/30/2023) for Routine chronic condition follow-up- can be virtual if desired.        Great to see you today.  I have refilled the medication(s) we provide.   If labs were collected or images ordered, we will inform you of  results once we have received them and reviewed. We will contact you either by echart message, or telephone call.  Please give ample time to the testing facility, and our office to run,  receive and review results. Please do not call inquiring of results, even if you can see them in your chart. We will contact you as soon as we are able. If it has been over 1 week since the test was completed, and you have not yet heard from us , then please call us .    - echart message- for normal results that have been seen by the patient already.   - telephone call: abnormal results or if patient has not viewed results in their echart.  If a referral to a specialist was entered for you, please call us  in 2 weeks if you have not heard from the specialist office to schedule.

## 2023-07-15 NOTE — Progress Notes (Signed)
 Patient ID: Nichole Ray, female  DOB: 1970/04/27, 53 y.o.   MRN: [id number] Patient Care Team    Relationship Specialty Notifications Start End  Mariel Shope, DO PCP - General Family Medicine  02/20/21   Guinevere Lefevre, OD Referring Physician Optometry  02/20/21     Chief Complaint  Patient presents with   Annual Exam    Chronic Conditions/illness Management     Subjective: Nichole Ray is a 53 y.o.  Female  present for cpe All past medical history, surgical history, allergies, family history, immunizations, medications and social history were updated in the electronic medical record today. All recent labs, ED visits and hospitalizations within the last year were reviewed.  Health maintenance:  Colonoscopy: no fhx. Cologuard normal 05/04/2021-repeat 3 years Mammogram: completed: 03/31/2023 [facility]-order placed for 2026 Cervical cancer screening: last pap: 2021-GYN> she is calling to est Immunizations: tdap UTD 2022, Influenza UTD 2024 (encouraged yearly), shingrix  series completed Infectious disease screening: HIV and Hep C completed DEXA: routine screen Patient has a Dental home. Hospitalizations/ED visits: reviewed  Anxiety/sleep disturbance: Patient reports Lexapro  10 mg daily works well for her.  However she is endorsing having decreased libido which she is uncertain if it is from perimenopause or medication.  She reports throughout the day she feels well as far as the anxiety perspective goes, at nighttime she sometimes will have difficulty falling asleep secondary to her brain being overactive.  Arthritis: Patient reports she does still take the diclofenac  on occasions and would like refills today.     07/15/2023    2:06 PM 05/28/2022    1:59 PM 04/29/2022    1:03 PM 03/05/2022    3:26 PM 10/18/2021    9:23 AM  Depression screen PHQ 2/9  Decreased Interest 0 0 0 0 0  Down, Depressed, Hopeless 0 0 0 0 0  PHQ - 2 Score 0 0 0 0 0  Altered sleeping 0 0   0  Tired,  decreased energy 0 0   1  Change in appetite 0 0   0  Feeling bad or failure about yourself  0 0   0  Trouble concentrating 0 0   0  Moving slowly or fidgety/restless 0 0   0  Suicidal thoughts 0 0   0  PHQ-9 Score 0 0   1  Difficult doing work/chores Not difficult at all          07/15/2023    2:06 PM 04/29/2022    1:03 PM 10/18/2021    9:23 AM 04/18/2021    9:36 AM  GAD 7 : Generalized Anxiety Score  Nervous, Anxious, on Edge 0 0 0 0  Control/stop worrying 0 0 0 0  Worry too much - different things 1 0 0 1  Trouble relaxing 0 0 0 0  Restless 0 0 0 0  Easily annoyed or irritable 0 0 0 0  Afraid - awful might happen 0 0 0 1  Total GAD 7 Score 1 0 0 2  Anxiety Difficulty Not difficult at all       Immunization History  Administered Date(s) Administered   Fluzone Influenza virus vaccine,trivalent (IIV3), split virus 12/15/2018   Influenza,inj,Quad PF,6+ Mos 11/18/2017, 02/20/2021   Influenza,inj,quad, With Preservative 01/24/2015   Influenza-Unspecified 11/22/2015, 11/11/2021   Moderna SARS-COV2 Booster Vaccination 01/08/2020   Moderna Sars-Covid-2 Vaccination 04/19/2019, 05/17/2019   Td 04/25/2009   Tdap 02/12/2020   Zoster Recombinant(Shingrix ) 10/18/2021, 01/18/2022   Past Medical History:  Diagnosis Date   Allergies    Migraines    Tuberculosis    UTI (urinary tract infection)    Allergies  Allergen Reactions   Penicillins Rash   Past Surgical History:  Procedure Laterality Date   APPENDECTOMY  1990   Family History  Problem Relation Age of Onset   Hypertension Mother    Alcohol abuse Father    Asthma Paternal Grandfather    Arthritis Paternal Grandfather    Depression Daughter    Social History   Social History Narrative   Marital status/children/pets: Married   Education/employment: Bachelor's degree, employed.   Safety:      -smoke alarm in the home:Yes     - wears seatbelt: Yes     - Feels safe in their relationships: Yes       Allergies as of  07/15/2023       Reactions   Penicillins Rash        Medication List        Accurate as of July 15, 2023  3:20 PM. If you have any questions, ask your nurse or doctor.          diclofenac  75 MG EC tablet Commonly known as: VOLTAREN  Take 1 tablet (75 mg total) by mouth 2 (two) times daily. What changed:  when to take this reasons to take this   escitalopram  10 MG tablet Commonly known as: LEXAPRO  Take 1 tablet (10 mg total) by mouth daily.   traZODone 50 MG tablet Commonly known as: DESYREL Take 0.5-1.5 tablets (25-75 mg total) by mouth at bedtime as needed for sleep. Started by: Napolean Backbone        All past medical history, surgical history, allergies, family history, immunizations andmedications were updated in the EMR today and reviewed under the history and medication portions of their EMR.     No results found for this or any previous visit (from the past 2160 hours).    ROS 14 pt review of systems performed and negative (unless mentioned in an HPI)  Objective: BP 106/76   Pulse 81   Temp 97.9 F (36.6 C)   Ht 5\' 6"  (1.676 m)   Wt 174 lb (78.9 kg)   SpO2 99%   BMI 28.08 kg/m  Physical Exam Vitals and nursing note reviewed.  Constitutional:      General: She is not in acute distress.    Appearance: Normal appearance. She is not ill-appearing or toxic-appearing.  HENT:     Head: Normocephalic and atraumatic.     Right Ear: Tympanic membrane, ear canal and external ear normal. There is no impacted cerumen.     Left Ear: Tympanic membrane, ear canal and external ear normal. There is no impacted cerumen.     Nose: No congestion or rhinorrhea.     Mouth/Throat:     Mouth: Mucous membranes are moist.     Pharynx: Oropharynx is clear. No oropharyngeal exudate or posterior oropharyngeal erythema.  Eyes:     General: No scleral icterus.       Right eye: No discharge.        Left eye: No discharge.     Extraocular Movements: Extraocular movements  intact.     Conjunctiva/sclera: Conjunctivae normal.     Pupils: Pupils are equal, round, and reactive to light.  Cardiovascular:     Rate and Rhythm: Normal rate and regular rhythm.     Pulses: Normal pulses.     Heart sounds: Normal heart sounds. No  murmur heard.    No friction rub. No gallop.  Pulmonary:     Effort: Pulmonary effort is normal. No respiratory distress.     Breath sounds: Normal breath sounds. No stridor. No wheezing, rhonchi or rales.  Chest:     Chest wall: No tenderness.  Abdominal:     General: Abdomen is flat. Bowel sounds are normal. There is no distension.     Palpations: Abdomen is soft. There is no mass.     Tenderness: There is no abdominal tenderness. There is no right CVA tenderness, left CVA tenderness, guarding or rebound.     Hernia: No hernia is present.  Musculoskeletal:        General: No swelling, tenderness or deformity. Normal range of motion.     Cervical back: Normal range of motion and neck supple. No rigidity or tenderness.     Right lower leg: No edema.     Left lower leg: No edema.  Lymphadenopathy:     Cervical: No cervical adenopathy.  Skin:    General: Skin is warm and dry.     Coloration: Skin is not jaundiced or pale.     Findings: No bruising, erythema, lesion or rash.  Neurological:     General: No focal deficit present.     Mental Status: She is alert and oriented to person, place, and time. Mental status is at baseline.     Cranial Nerves: No cranial nerve deficit.     Sensory: No sensory deficit.     Motor: No weakness.     Coordination: Coordination normal.     Gait: Gait normal.     Deep Tendon Reflexes: Reflexes normal.  Psychiatric:        Mood and Affect: Mood normal.        Behavior: Behavior normal.        Thought Content: Thought content normal.        Judgment: Judgment normal.      No results found.  Assessment/plan: Nichole Ray is a 53 y.o. female present for CPE and chronic condition  management Routine general medical examination at a health care facility Patient was encouraged to exercise greater than 150 minutes a week. Patient was encouraged to choose a diet filled with fresh fruits and vegetables, and lean meats. AVS provided to patient today for education/recommendation on gender specific health and safety maintenance. Colonoscopy: no fhx. Cologuard normal 05/04/2021-repeat 3 years Mammogram: completed: 03/31/2023 [facility]-order placed for 2026 Cervical cancer screening: last pap: 2021-GYN> she is calling to est Immunizations: tdap UTD 2022, Influenza UTD 2024 (encouraged yearly), shingrix  series completed Infectious disease screening: HIV and Hep C completed DEXA: routine screen - CBC - Comprehensive metabolic panel with GFR - TSH Arthritis: Continue diclofenac  twice daily as needed Vitamin D  deficiency - Vitamin D  (25 hydroxy)  Situational anxiety/sleep disturbance/decreased libido Discussed different options with her today for treatment I would be anxiety/sleep disturbance.  Perimenopausal symptoms could be causing a decrease in libido.  We also discussed that Lexapro  could be contributing as well.  Since she reports majority of her symptoms are difficulty falling asleep secondary to overactive thinking, we elected to decrease the Lexapro  to 5 mg daily on a trial, we will starting trazodone taper at night.  If we are able to completely remove the Lexapro  and treat with trazodone, she may see an increase in libido.  Lipid screening - Lipid panel Diabetes mellitus screening - Hemoglobin A1c E66.3(BMI 25.0-29.9) Discussed diet and exercise - Hemoglobin  A1c - Lipid panel   Return in about 24 weeks (around 12/30/2023) for Routine chronic condition follow-up- can be virtual if desired.  Orders Placed This Encounter  Procedures   CBC   Comprehensive metabolic panel with GFR   Hemoglobin A1c   Lipid panel   TSH   Vitamin D  (25 hydroxy)   Meds ordered  this encounter  Medications   escitalopram  (LEXAPRO ) 10 MG tablet    Sig: Take 1 tablet (10 mg total) by mouth daily.    Dispense:  90 tablet    Refill:  1   diclofenac  (VOLTAREN ) 75 MG EC tablet    Sig: Take 1 tablet (75 mg total) by mouth 2 (two) times daily.    Dispense:  60 tablet    Refill:  5   traZODone (DESYREL) 50 MG tablet    Sig: Take 0.5-1.5 tablets (25-75 mg total) by mouth at bedtime as needed for sleep.    Dispense:  45 tablet    Refill:  5   Referral Orders  No referral(s) requested today     Electronically signed by: Napolean Backbone, DO [facility] [place]

## 2023-07-16 ENCOUNTER — Ambulatory Visit: Payer: Self-pay | Admitting: Family Medicine

## 2023-07-16 LAB — COMPREHENSIVE METABOLIC PANEL WITH GFR
AG Ratio: 1.5 (calc) (ref 1.0–2.5)
ALT: 25 U/L (ref 6–29)
AST: 23 U/L (ref 10–35)
Albumin: 4.5 g/dL (ref 3.6–5.1)
Alkaline phosphatase (APISO): 68 U/L (ref 37–153)
BUN: 16 mg/dL (ref 7–25)
CO2: 26 mmol/L (ref 20–32)
Calcium: 9.2 mg/dL (ref 8.6–10.4)
Chloride: 105 mmol/L (ref 98–110)
Creat: 0.94 mg/dL (ref 0.50–1.03)
Globulin: 3 g/dL (ref 1.9–3.7)
Glucose, Bld: 77 mg/dL (ref 65–99)
Potassium: 4.1 mmol/L (ref 3.5–5.3)
Sodium: 141 mmol/L (ref 135–146)
Total Bilirubin: 0.6 mg/dL (ref 0.2–1.2)
Total Protein: 7.5 g/dL (ref 6.1–8.1)
eGFR: 73 mL/min/{1.73_m2} (ref >=60)

## 2023-07-16 LAB — LIPID PANEL
Cholesterol: 225 mg/dL — ABNORMAL HIGH (ref <200)
HDL: 56 mg/dL (ref >=50)
LDL Cholesterol (Calc): 149 mg/dL — ABNORMAL HIGH
Non-HDL Cholesterol (Calc): 169 mg/dL — ABNORMAL HIGH (ref <130)
Total CHOL/HDL Ratio: 4 (calc) (ref <5.0)
Triglycerides: 90 mg/dL (ref <150)

## 2023-07-16 LAB — CBC
HCT: 44.5 % (ref 35.0–45.0)
Hemoglobin: 14.5 g/dL (ref 11.7–15.5)
MCH: 31.6 pg (ref 27.0–33.0)
MCHC: 32.6 g/dL (ref 32.0–36.0)
MCV: 96.9 fL (ref 80.0–100.0)
MPV: 10.9 fL (ref 7.5–12.5)
Platelets: 367 10*3/uL (ref 140–400)
RBC: 4.59 10*6/uL (ref 3.80–5.10)
RDW: 13.1 % (ref 11.0–15.0)
WBC: 5.7 10*3/uL (ref 3.8–10.8)

## 2023-07-16 LAB — HEMOGLOBIN A1C
Hgb A1c MFr Bld: 5.5 % (ref <5.7)
Mean Plasma Glucose: 111 mg/dL
eAG (mmol/L): 6.2 mmol/L

## 2023-07-16 LAB — TSH: TSH: 1.75 m[IU]/L

## 2023-07-16 LAB — VITAMIN D 25 HYDROXY (VIT D DEFICIENCY, FRACTURES): Vit D, 25-Hydroxy: 26 ng/mL — ABNORMAL LOW (ref 30–100)

## 2023-09-16 LAB — HM PAP SMEAR: HPV, high-risk: NEGATIVE

## 2023-09-16 LAB — RESULTS CONSOLE HPV: CHL HPV: NEGATIVE

## 2023-12-30 ENCOUNTER — Encounter: Payer: Self-pay | Admitting: Family Medicine

## 2023-12-30 ENCOUNTER — Telehealth: Admitting: Family Medicine

## 2023-12-30 DIAGNOSIS — E559 Vitamin D deficiency, unspecified: Secondary | ICD-10-CM | POA: Diagnosis not present

## 2023-12-30 DIAGNOSIS — F418 Other specified anxiety disorders: Secondary | ICD-10-CM | POA: Diagnosis not present

## 2023-12-30 DIAGNOSIS — M1990 Unspecified osteoarthritis, unspecified site: Secondary | ICD-10-CM | POA: Diagnosis not present

## 2023-12-30 DIAGNOSIS — R232 Flushing: Secondary | ICD-10-CM | POA: Diagnosis not present

## 2023-12-30 MED ORDER — DICLOFENAC SODIUM 75 MG PO TBEC: 75.0000 mg | DELAYED_RELEASE_TABLET | Freq: Two times a day (BID) | ORAL | 5 refills | Status: AC

## 2023-12-30 MED ORDER — ESCITALOPRAM OXALATE 10 MG PO TABS: 10.0000 mg | ORAL_TABLET | Freq: Every day | ORAL | 1 refills | Status: AC

## 2023-12-30 NOTE — Progress Notes (Signed)
 VIRTUAL VISIT VIA VIDEO  I connected with [name] on 12/30/23 at 11:00 AM EST by a video enabled telemedicine application and verified that I am speaking with the correct person using two identifiers. Location patient: Home Location provider: [facility], Office Persons participating in the virtual visit: Patient, Dr. Catherine and ALONSO Sharps, CMA  I discussed the limitations of evaluation and management by telemedicine and the availability of in person appointments. The patient expressed understanding and agreed to proceed.      Patient ID: Nichole Ray, female  DOB: 07-19-1970, 53 y.o.   MRN: [id number] Patient Care Team    Relationship Specialty Notifications Start End  Catherine Charlies LABOR, DO PCP - General Family Medicine  02/20/21   Lita Lye, OD Referring Physician Optometry  02/20/21   BURNARD BOWERS Consulting Physician Gynecology  02/11/22     Chief Complaint  Patient presents with   Anxiety    Chronic condition management    Subjective: Nichole Ray is a 53 y.o.  Female  present for Chronic Conditions/illness Management All past medical history, surgical history, allergies, family history, immunizations, medications and social history were updated in the electronic medical record today. All recent labs, ED visits and hospitalizations within the last year were reviewed.  Anxiety/sleep disturbance: Patient reports compliance with Lexapro  10 mg daily and trazodone  50 mg qhs,  she feels medication works well for her. She only used the trazodone  a few times  However she is endorsing having decreased libido which she is uncertain if it is from perimenopause or medication.    Arthritis: Patient reports she does still take the diclofenac  on occasions.  Medications helpful, no complaints.  No negative side effects     12/30/2023   11:01 AM 07/15/2023    2:06 PM 05/28/2022    1:59 PM 04/29/2022    1:03 PM 03/05/2022    3:26 PM  Depression screen PHQ 2/9  Decreased  Interest 0 0 0 0 0  Down, Depressed, Hopeless 0 0 0 0 0  PHQ - 2 Score 0 0 0 0 0  Altered sleeping 0 0 0    Tired, decreased energy 0 0 0    Change in appetite 0 0 0    Feeling bad or failure about yourself  0 0 0    Trouble concentrating 0 0 0    Moving slowly or fidgety/restless 0 0 0    Suicidal thoughts 0 0 0    PHQ-9 Score 0 0  0     Difficult doing work/chores Not difficult at all Not difficult at all        Data saved with a previous flowsheet row definition      12/30/2023   11:02 AM 07/15/2023    2:06 PM 04/29/2022    1:03 PM 10/18/2021    9:23 AM  GAD 7 : Generalized Anxiety Score  Nervous, Anxious, on Edge 0 0 0 0  Control/stop worrying 1 0 0 0  Worry too much - different things 0 1 0 0  Trouble relaxing 0 0 0 0  Restless 0 0 0 0  Easily annoyed or irritable 0 0 0 0  Afraid - awful might happen 0 0 0 0  Total GAD 7 Score 1 1 0 0  Anxiety Difficulty Not difficult at all Not difficult at all      Immunization History  Administered Date(s) Administered   Fluzone Influenza virus vaccine,trivalent (IIV3), split virus 12/15/2018  Influenza, Seasonal, Injecte, Preservative Fre 12/09/2023   Influenza,inj,Quad PF,6+ Mos 11/18/2017, 02/20/2021   Influenza,inj,quad, With Preservative 01/24/2015   Influenza-Unspecified 11/22/2015, 11/11/2021   Moderna SARS-COV2 Booster Vaccination 01/08/2020   Moderna Sars-Covid-2 Vaccination 04/19/2019, 05/17/2019   Td 04/25/2009   Tdap 02/12/2020   Zoster Recombinant(Shingrix ) 10/18/2021, 01/18/2022   Past Medical History:  Diagnosis Date   Allergies    Migraines    Tuberculosis    UTI (urinary tract infection)    Allergies  Allergen Reactions   Penicillins Rash   Past Surgical History:  Procedure Laterality Date   APPENDECTOMY  1990   Family History  Problem Relation Age of Onset   Hypertension Mother    Alcohol abuse Father    Asthma Paternal Grandfather    Arthritis Paternal Grandfather    Depression Daughter     Social History   Social History Narrative   Marital status/children/pets: Married   Education/employment: Bachelor's degree, employed.   Safety:      -smoke alarm in the home:Yes     - wears seatbelt: Yes     - Feels safe in their relationships: Yes       Allergies as of 12/30/2023       Reactions   Penicillins Rash        Medication List        Accurate as of December 30, 2023 11:10 AM. If you have any questions, ask your nurse or doctor.          diclofenac  75 MG EC tablet Commonly known as: VOLTAREN  Take 1 tablet (75 mg total) by mouth 2 (two) times daily.   escitalopram  10 MG tablet Commonly known as: LEXAPRO  Take 1 tablet (10 mg total) by mouth daily.   traZODone  50 MG tablet Commonly known as: DESYREL  Take 0.5-1.5 tablets (25-75 mg total) by mouth at bedtime as needed for sleep.        All past medical history, surgical history, allergies, family history, immunizations andmedications were updated in the EMR today and reviewed under the history and medication portions of their EMR.     No results found for this or any previous visit (from the past 2160 hours).    Review of Systems  All other systems reviewed and are negative.  14 pt review of systems performed and negative (unless mentioned in an HPI)  Objective: There were no vitals taken for this visit. Physical Exam Vitals and nursing note reviewed.  Constitutional:      General: She is not in acute distress.    Appearance: Normal appearance. She is not toxic-appearing.  HENT:     Head: Normocephalic and atraumatic.  Eyes:     General: No scleral icterus.       Right eye: No discharge.        Left eye: No discharge.     Conjunctiva/sclera: Conjunctivae normal.  Pulmonary:     Effort: Pulmonary effort is normal.  Musculoskeletal:     Cervical back: Normal range of motion.  Skin:    Findings: No rash.  Neurological:     Mental Status: She is alert and oriented to person, place,  and time. Mental status is at baseline.  Psychiatric:        Mood and Affect: Mood normal.        Behavior: Behavior normal.        Thought Content: Thought content normal.        Judgment: Judgment normal.      No results  found.  Assessment/plan: Nichole Ray is a 53 y.o. female present for chronic condition management Arthritis: Stable Continue diclofenac  twice daily as needed  Vitamin D  deficiency - Labs up-to-date. Continue vitamin D  supplementation  Situational anxiety/sleep disturbance/decreased libido Stable Continue Lexapro  10 mg daily Continue trazodone  50 mg at bedtime as needed (only used a few times)   Return in about 7 months (around 07/15/2024) for cpe (20 min), Routine chronic condition follow-up.  No orders of the defined types were placed in this encounter.  Meds ordered this encounter  Medications   escitalopram  (LEXAPRO ) 10 MG tablet    Sig: Take 1 tablet (10 mg total) by mouth daily.    Dispense:  90 tablet    Refill:  1   diclofenac  (VOLTAREN ) 75 MG EC tablet    Sig: Take 1 tablet (75 mg total) by mouth 2 (two) times daily.    Dispense:  60 tablet    Refill:  5   Referral Orders  No referral(s) requested today     Electronically signed by: Charlies Bellini, DO [facility] [place]

## 2023-12-30 NOTE — Patient Instructions (Addendum)

## 2024-03-16 ENCOUNTER — Ambulatory Visit: Payer: Self-pay

## 2024-03-16 ENCOUNTER — Encounter: Payer: Self-pay | Admitting: Sports Medicine

## 2024-03-16 ENCOUNTER — Ambulatory Visit: Admitting: Sports Medicine

## 2024-03-16 VITALS — BP 108/72 | HR 63 | Temp 97.9°F | Wt 183.1 lb

## 2024-03-16 DIAGNOSIS — H60502 Unspecified acute noninfective otitis externa, left ear: Secondary | ICD-10-CM

## 2024-03-16 MED ORDER — CIPROFLOXACIN-DEXAMETHASONE 0.3-0.1 % OT SUSP: 4.0000 [drp] | Freq: Two times a day (BID) | OTIC | 0 refills | Status: AC

## 2024-03-16 NOTE — Progress Notes (Addendum)
 "  Careteam: Patient Care Team: Catherine Charlies LABOR, DO as PCP - General (Family Medicine) Lita Lye, OD as Referring Physician (Optometry) BURNARD BOWERS as Consulting Physician (Gynecology)  Allergies[1]  Chief Complaint  Patient presents with   Ear Fullness    Pt started having ear pain that started last night. Today the pain is in hear ear all the way to her neck. She denies any other symtoms    Discussed the use of AI scribe software for clinical note transcription with the patient, who gave verbal consent to proceed.  History of Present Illness    Nichole Ray is a 54 year old female who presents with left ear discomfort and pressure sensation.  Since yesterday, she has been experiencing a sensation in her left ear that feels like being underwater, accompanied by occasional popping sounds. The discomfort sometimes extends down the left side of her neck. She describes the pain as constant and rates it as a 5 out of 10 in severity.  She is planning to travel soon and is concerned about the ear discomfort in relation to flying.  No fever, cold symptoms, post-nasal drip, throat pain, ear drainage, ringing in the ears, hearing changes, cough, shortness of breath, stomach pain, nausea, vomiting, urinary symptoms, or sinus congestion.  Review of Systems:  Review of Systems  Constitutional:  Negative for chills and fever.  HENT:  Positive for ear pain. Negative for congestion, ear discharge, sinus pain, sore throat and tinnitus.   Eyes:  Negative for double vision.  Respiratory:  Negative for cough, sputum production and shortness of breath.   Cardiovascular:  Negative for chest pain, palpitations and leg swelling.  Gastrointestinal:  Negative for abdominal pain, heartburn and nausea.  Genitourinary:  Negative for dysuria, frequency and hematuria.  Musculoskeletal:  Negative for falls and myalgias.  Neurological:  Negative for dizziness, sensory change and focal weakness.    Negative unless indicated in HPI.   Patient Active Problem List   Diagnosis Date Noted   Arthritis 07/15/2023   Situational anxiety 04/18/2021   Hot flashes 04/18/2021   E66.3(BMI 25.0-29.9) 04/18/2021   Irregular menstrual cycle 02/20/2021   Vitamin D  deficiency 02/20/2021   Past Medical History:  Diagnosis Date   Allergies    Migraines    Tuberculosis    UTI (urinary tract infection)    Past Surgical History:  Procedure Laterality Date   APPENDECTOMY  1990   Social History[2] Family History  Problem Relation Age of Onset   Hypertension Mother    Alcohol abuse Father    Asthma Paternal Grandfather    Arthritis Paternal Grandfather    Depression Daughter    Allergies[3]  Medications: Patient's Medications  New Prescriptions   CIPROFLOXACIN -DEXAMETHASONE  (CIPRODEX ) OTIC SUSPENSION    Place 4 drops into the left ear 2 (two) times daily.  Previous Medications   DICLOFENAC  (VOLTAREN ) 75 MG EC TABLET    Take 1 tablet (75 mg total) by mouth 2 (two) times daily.   ESCITALOPRAM  (LEXAPRO ) 10 MG TABLET    Take 1 tablet (10 mg total) by mouth daily.   TRAZODONE  (DESYREL ) 50 MG TABLET    Take 0.5-1.5 tablets (25-75 mg total) by mouth at bedtime as needed for sleep.  Modified Medications   No medications on file  Discontinued Medications   No medications on file    Physical Exam: Vitals:   03/16/24 1401  BP: 108/72  Pulse: 63  Temp: 97.9 F (36.6 C)  TempSrc: Oral  SpO2: 97%  Weight: 183 lb 1.9 oz (83.1 kg)   Body mass index is 29.56 kg/m. BP Readings from Last 3 Encounters:  03/16/24 108/72  07/15/23 106/76  05/28/22 105/71   Wt Readings from Last 3 Encounters:  03/16/24 183 lb 1.9 oz (83.1 kg)  07/15/23 174 lb (78.9 kg)  05/28/22 175 lb 12.8 oz (79.7 kg)    Physical Exam Constitutional:      Appearance: Normal appearance.  HENT:     Head: Normocephalic and atraumatic.     Ears:     Comments: Left TM - no redness , cloudy TM  No  discharge Cardiovascular:     Rate and Rhythm: Normal rate and regular rhythm.  Pulmonary:     Effort: Pulmonary effort is normal. No respiratory distress.     Breath sounds: Normal breath sounds. No wheezing.  Abdominal:     General: Bowel sounds are normal. There is no distension.     Tenderness: There is no abdominal tenderness. There is no guarding or rebound.     Comments:    Musculoskeletal:        General: No swelling or tenderness.  Skin:    General: Skin is dry.  Neurological:     Mental Status: She is alert. Mental status is at baseline.     Sensory: No sensory deficit.     Motor: No weakness.     Labs reviewed: Basic Metabolic Panel: Recent Labs    07/15/23 1403  NA 141  K 4.1  CL 105  CO2 26  GLUCOSE 77  BUN 16  CREATININE 0.94  CALCIUM 9.2  TSH 1.75   Liver Function Tests: Recent Labs    07/15/23 1403  AST 23  ALT 25  BILITOT 0.6  PROT 7.5   No results for input(s): LIPASE, AMYLASE in the last 8760 hours. No results for input(s): AMMONIA in the last 8760 hours. CBC: Recent Labs    07/15/23 1403  WBC 5.7  HGB 14.5  HCT 44.5  MCV 96.9  PLT 367   Lipid Panel: Recent Labs    07/15/23 1403  CHOL 225*  HDL 56  LDLCALC 149*  TRIG 90  CHOLHDL 4.0   TSH: Recent Labs    07/15/23 1403  TSH 1.75   A1C: Lab Results  Component Value Date   HGBA1C 5.5 07/15/2023    Assessment & Plan Acute otitis externa of left ear, unspecified type Pt c/o pain in her left ear Denies fevers, chills, runny nose, cough, sore throat On exam- tm cloudy, no discharge noted Will send ciprodex  to pharmacy Orders:   ciprofloxacin -dexamethasone  (CIPRODEX ) OTIC suspension; Place 4 drops into the left ear 2 (two) times daily.   No follow-ups on file.:   Cherise Fedder     [1]  Allergies Allergen Reactions   Penicillins Rash  [2]  Social History Tobacco Use   Smoking status: Never    Passive exposure: Never   Smokeless tobacco:  Never  Vaping Use   Vaping status: Never Used  Substance Use Topics   Alcohol use: Not Currently   Drug use: Never  [3]  Allergies Allergen Reactions   Penicillins Rash   "

## 2024-03-19 ENCOUNTER — Other Ambulatory Visit (HOSPITAL_BASED_OUTPATIENT_CLINIC_OR_DEPARTMENT_OTHER): Payer: Self-pay | Admitting: Family Medicine

## 2024-03-19 DIAGNOSIS — Z1231 Encounter for screening mammogram for malignant neoplasm of breast: Secondary | ICD-10-CM

## 2024-04-01 ENCOUNTER — Ambulatory Visit (HOSPITAL_BASED_OUTPATIENT_CLINIC_OR_DEPARTMENT_OTHER)
